# Patient Record
Sex: Female | Born: 1975 | Race: White | Hispanic: No | Marital: Married | Smoking: Never smoker
Health system: Southern US, Community
[De-identification: ages and names within clinical notes are randomized; demographics above are authoritative.]

## PROBLEM LIST (undated history)

## (undated) DIAGNOSIS — Z9889 Other specified postprocedural states: Secondary | ICD-10-CM

## (undated) DIAGNOSIS — T7840XA Allergy, unspecified, initial encounter: Secondary | ICD-10-CM

## (undated) DIAGNOSIS — M2629 Other anomalies of dental arch relationship: Secondary | ICD-10-CM

## (undated) DIAGNOSIS — I341 Nonrheumatic mitral (valve) prolapse: Secondary | ICD-10-CM

## (undated) DIAGNOSIS — R112 Nausea with vomiting, unspecified: Secondary | ICD-10-CM

## (undated) DIAGNOSIS — C4492 Squamous cell carcinoma of skin, unspecified: Secondary | ICD-10-CM

## (undated) DIAGNOSIS — Z800 Family history of malignant neoplasm of digestive organs: Secondary | ICD-10-CM

## (undated) DIAGNOSIS — C4491 Basal cell carcinoma of skin, unspecified: Secondary | ICD-10-CM

## (undated) DIAGNOSIS — J309 Allergic rhinitis, unspecified: Secondary | ICD-10-CM

## (undated) DIAGNOSIS — R011 Cardiac murmur, unspecified: Secondary | ICD-10-CM

## (undated) HISTORY — PX: TONSILLECTOMY: SHX5217

## (undated) HISTORY — DX: Nonrheumatic mitral (valve) prolapse: I34.1

## (undated) HISTORY — PX: SKIN CANCER EXCISION: SHX779

## (undated) HISTORY — DX: Cardiac murmur, unspecified: R01.1

## (undated) HISTORY — DX: Squamous cell carcinoma of skin, unspecified: C44.92

## (undated) HISTORY — DX: Allergy, unspecified, initial encounter: T78.40XA

## (undated) HISTORY — DX: Other anomalies of dental arch relationship: M26.29

## (undated) HISTORY — DX: Allergic rhinitis, unspecified: J30.9

## (undated) HISTORY — DX: Family history of malignant neoplasm of digestive organs: Z80.0

## (undated) HISTORY — DX: Basal cell carcinoma of skin, unspecified: C44.91

---
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
## 1997-09-18 ENCOUNTER — Other Ambulatory Visit: Admission: RE | Admit: 1997-09-18 | Discharge: 1997-09-18 | Payer: Self-pay | Admitting: Obstetrics and Gynecology

## 1998-10-08 ENCOUNTER — Other Ambulatory Visit: Admission: RE | Admit: 1998-10-08 | Discharge: 1998-10-08 | Payer: Self-pay | Admitting: Obstetrics and Gynecology

## 1998-12-26 ENCOUNTER — Other Ambulatory Visit: Admission: RE | Admit: 1998-12-26 | Discharge: 1998-12-26 | Payer: Self-pay | Admitting: Obstetrics and Gynecology

## 1999-11-24 ENCOUNTER — Other Ambulatory Visit: Admission: RE | Admit: 1999-11-24 | Discharge: 1999-11-24 | Payer: Self-pay | Admitting: Obstetrics and Gynecology

## 2007-10-03 DIAGNOSIS — C4490 Unspecified malignant neoplasm of skin, unspecified: Secondary | ICD-10-CM | POA: Insufficient documentation

## 2008-05-27 ENCOUNTER — Emergency Department (HOSPITAL_COMMUNITY): Admission: EM | Admit: 2008-05-27 | Discharge: 2008-05-27 | Payer: Self-pay | Admitting: Family Medicine

## 2009-05-11 DIAGNOSIS — C4491 Basal cell carcinoma of skin, unspecified: Secondary | ICD-10-CM

## 2009-05-11 HISTORY — DX: Basal cell carcinoma of skin, unspecified: C44.91

## 2010-12-15 ENCOUNTER — Ambulatory Visit: Payer: Self-pay | Admitting: Cardiology

## 2011-01-22 ENCOUNTER — Ambulatory Visit: Payer: Self-pay | Admitting: Cardiology

## 2011-01-27 ENCOUNTER — Encounter: Payer: Self-pay | Admitting: *Deleted

## 2011-01-28 ENCOUNTER — Encounter: Payer: Self-pay | Admitting: Cardiology

## 2011-01-28 ENCOUNTER — Ambulatory Visit (INDEPENDENT_AMBULATORY_CARE_PROVIDER_SITE_OTHER): Payer: BC Managed Care – PPO | Admitting: Cardiology

## 2011-01-28 VITALS — BP 110/70 | HR 55 | Resp 18 | Ht 64.0 in | Wt 136.1 lb

## 2011-01-28 DIAGNOSIS — R011 Cardiac murmur, unspecified: Secondary | ICD-10-CM

## 2011-01-28 NOTE — Patient Instructions (Signed)
Your physician has requested that you have an echocardiogram. Echocardiography is a painless test that uses sound waves to create images of your heart. It provides your doctor with information about the size and shape of your heart and how well your heart's chambers and valves are working. This procedure takes approximately one hour. There are no restrictions for this procedure.  You do not need to schedule a follow-up appointment with Dr Shirlee Latch.

## 2011-01-30 DIAGNOSIS — R011 Cardiac murmur, unspecified: Secondary | ICD-10-CM | POA: Insufficient documentation

## 2011-01-30 NOTE — Assessment & Plan Note (Signed)
Soft systolic murmur towards apex with possible systolic click.  It does sound like she may have mitral valve prolapse with some degree of mitral regurgitation.  She does not need to take antibiotics with dental work.  She has no history of exercise intolerance.  I will get an echo to assess the mitral valve.

## 2011-01-30 NOTE — Progress Notes (Signed)
35 yo with history of heart murmur presents for cardiology evaluation.  Patient was told in high school that she has a heart murmur.  This was noted on a routine physical.  At some point, she was told that she has mitral valve prolapse, but she does not remember getting an echocardiogram.  She was told to take antibiotics before dental work.  She has never had exercise intolerance.  She can jog without problems.  No syncope or lightheadedness.  No tachypalpitations.  No chest pain.  She recently moved to [facility] and had a life insurance examination.  They told her that she should have her heart murmur re-evaluated.  She has no other significant medical problems.    ECG: NSR, rate 55, normal  PMH: Heart murmur  SH: Recently moved to [place] from [place].  Married with children.  No smoking.    FH: No family history of heart disease that she knows of.   ROS: all systems reviewed and negative except as per HPI  Current Outpatient Prescriptions  Medication Sig Dispense Refill  . ibuprofen (ADVIL,MOTRIN) 200 MG tablet Take 200 mg by mouth every 6 (six) hours as needed.          BP 110/70  Pulse 55  Resp 18  Ht 5\' 4"  (1.626 m)  Wt 136 lb 1.9 oz (61.744 kg)  BMI 23.37 kg/m2 General: NAD Neck: No JVD, no thyromegaly or thyroid nodule.  Lungs: Clear to auscultation bilaterally with normal respiratory effort. CV: Nondisplaced PMI.  Heart regular S1/S2, no S3/S4, systolic click with soft systolic murmrur.  No peripheral edema.  No carotid bruit.  Normal pedal pulses.  Abdomen: Soft, nontender, no hepatosplenomegaly, no distention.  Skin: Intact without lesions or rashes.  Neurologic: Alert and oriented x 3.  Psych: Normal affect. Extremities: No clubbing or cyanosis.  HEENT: Normal.

## 2011-02-05 ENCOUNTER — Encounter: Payer: Self-pay | Admitting: *Deleted

## 2011-02-10 ENCOUNTER — Ambulatory Visit (HOSPITAL_COMMUNITY): Payer: BC Managed Care – PPO | Attending: Cardiology

## 2011-02-10 DIAGNOSIS — I359 Nonrheumatic aortic valve disorder, unspecified: Secondary | ICD-10-CM | POA: Insufficient documentation

## 2011-02-10 DIAGNOSIS — I079 Rheumatic tricuspid valve disease, unspecified: Secondary | ICD-10-CM | POA: Insufficient documentation

## 2011-02-10 DIAGNOSIS — R011 Cardiac murmur, unspecified: Secondary | ICD-10-CM | POA: Insufficient documentation

## 2011-03-20 ENCOUNTER — Other Ambulatory Visit: Payer: Self-pay | Admitting: Obstetrics and Gynecology

## 2011-03-20 DIAGNOSIS — N644 Mastodynia: Secondary | ICD-10-CM

## 2011-04-06 ENCOUNTER — Other Ambulatory Visit: Payer: Self-pay | Admitting: Obstetrics and Gynecology

## 2011-04-06 ENCOUNTER — Ambulatory Visit
Admission: RE | Admit: 2011-04-06 | Discharge: 2011-04-06 | Disposition: A | Discharge status: Home / Self Care | Payer: BC Managed Care – PPO | Source: Ambulatory Visit | Attending: Obstetrics and Gynecology | Admitting: Obstetrics and Gynecology

## 2011-04-06 DIAGNOSIS — N644 Mastodynia: Secondary | ICD-10-CM

## 2011-06-07 ENCOUNTER — Emergency Department (INDEPENDENT_AMBULATORY_CARE_PROVIDER_SITE_OTHER): Payer: BC Managed Care – PPO

## 2011-06-07 ENCOUNTER — Emergency Department (HOSPITAL_COMMUNITY)
Admission: EM | Admit: 2011-06-07 | Discharge: 2011-06-07 | Disposition: A | Discharge status: Home / Self Care | Payer: BC Managed Care – PPO | Source: Home / Self Care | Attending: Emergency Medicine | Admitting: Emergency Medicine

## 2011-06-07 ENCOUNTER — Encounter (HOSPITAL_COMMUNITY): Payer: Self-pay

## 2011-06-07 DIAGNOSIS — J159 Unspecified bacterial pneumonia: Secondary | ICD-10-CM

## 2011-06-07 MED ORDER — TRAMADOL HCL 50 MG PO TABS: 100.0000 mg | ORAL_TABLET | Freq: Three times a day (TID) | ORAL | Status: AC | PRN

## 2011-06-07 MED ORDER — LIDOCAINE HCL (PF) 1 % IJ SOLN
INTRAMUSCULAR | Status: AC
  Filled 2011-06-07: qty 5

## 2011-06-07 MED ORDER — CEFTRIAXONE SODIUM 1 G IJ SOLR
1.0000 g | Freq: Once | INTRAMUSCULAR | Status: AC
  Administered 2011-06-07: 1 g via INTRAMUSCULAR

## 2011-06-07 MED ORDER — FLUCONAZOLE 150 MG PO TABS: 150.0000 mg | ORAL_TABLET | Freq: Once | ORAL | Status: AC

## 2011-06-07 MED ORDER — CEFTRIAXONE SODIUM 1 G IJ SOLR
INTRAMUSCULAR | Status: AC
  Filled 2011-06-07: qty 10

## 2011-06-07 MED ORDER — CLARITHROMYCIN 500 MG PO TABS: 500.0000 mg | ORAL_TABLET | Freq: Two times a day (BID) | ORAL | Status: AC

## 2011-06-07 NOTE — ED Notes (Signed)
Pt has had cough and cold for two weeks and yesterday developed fever and lt sided chest pain with deep breath

## 2011-06-07 NOTE — ED Provider Notes (Signed)
Chief Complaint  Patient presents with  . Pneumonia    History of Present Illness:  The patient has had mild upper respiratory symptoms for the past 2 weeks with nasal congestion, rhinorrhea, scratchy throat, and a dry cough. This seemed to be getting better, however last night she noted the sudden onset of chest tightness, chills, felt feverish, had teeth chattering chills, and pleuritic left-sided chest pain which radiated up to her neck. She now has a cough productive of initially rusty-colored sputum. She denies any prior history of pneumonia.  Review of Systems:  Other than noted above, the patient denies any of the following symptoms. Systemic:  No fever, chills, sweats, fatigue, myalgias, headache, or anorexia. Eye:  No redness, pain or drainage. ENT:  No earache, nasal congestion, rhinorrhea, sinus pressure, or sore throat. Lungs:  No cough, sputum production, wheezing, shortness of breath. Or chest pain. GI:  No nausea, vomiting, abdominal pain or diarrhea. Skin:  No rash or itching.  PMFSH:  Past medical history, family history, social history, meds, and allergies were reviewed.  Physical Exam:   Vital signs:  BP 125/86  Pulse 75  Temp(Src) 98.1 F (36.7 C) (Oral)  Resp 16  SpO2 99% General:  Alert, in no distress. Eye:  No conjunctival injection or drainage. ENT:  TMs and canals were normal, without erythema or inflammation.  Nasal mucosa was clear and uncongested, without drainage.  Mucous membranes were moist.  Pharynx was clear, without exudate or drainage.  There were no oral ulcerations or lesions. Neck:  Supple, no adenopathy, tenderness or mass. Lungs:  No respiratory distress.  Lungs were clear to auscultation, without wheezes, rales or rhonchi.  Breath sounds were clear and equal bilaterally. Heart:  Regular rhythm, without gallops, murmers or rubs. Skin:  Clear, warm, and dry, without rash or lesions.  Labs:  No results found for this or any previous visit.    Radiology:  Dg Chest 2 View  06/07/2011  *RADIOLOGY REPORT*  Clinical Data: Cough and cold for 2 weeks.  Fever.  CHEST - 2 VIEW  Comparison: None  Findings: Heart size and mediastinal contours are normal.  No pleural effusion or pulmonary edema.  Airspace disease involving the lingular portion of the left lung is noted.  Right lung is clear.  IMPRESSION:  1.  Lingular pneumonia.  Original Report Authenticated By: Rosealee Albee, M.D.    Medications given in [facility]:  Rocephin 1 g IM  Assessment:   Diagnoses that have been ruled out:  None  Diagnoses that are still under consideration:  None  Final diagnoses:  Community acquired bacterial pneumonia      Plan:   1.  The following meds were prescribed:   New Prescriptions   CLARITHROMYCIN (BIAXIN) 500 MG TABLET    Take 1 tablet (500 mg total) by mouth 2 (two) times daily.   FLUCONAZOLE (DIFLUCAN) 150 MG TABLET    Take 1 tablet (150 mg total) by mouth once.   TRAMADOL (ULTRAM) 50 MG TABLET    Take 2 tablets (100 mg total) by mouth every 8 (eight) hours as needed for pain.   2.  The patient was instructed in symptomatic care and handouts were given. 3.  The patient was told to return if becoming worse in any way, if no better in 3 or 4 days, and given some red flag symptoms that would indicate earlier return. I told her to come back in 48 hours for a recheck. I don't think she needs  a repeat chest x-ray at that time. She should get a repeat chest x-ray in about a month.    Roque Lias, MD 06/07/11 [phone]

## 2011-09-30 LAB — OB RESULTS CONSOLE ANTIBODY SCREEN: Antibody Screen: NEGATIVE

## 2011-09-30 LAB — OB RESULTS CONSOLE HEPATITIS B SURFACE ANTIGEN: Hepatitis B Surface Ag: NEGATIVE

## 2011-09-30 LAB — OB RESULTS CONSOLE RUBELLA ANTIBODY, IGM: Rubella: IMMUNE

## 2012-04-25 ENCOUNTER — Encounter (HOSPITAL_COMMUNITY): Payer: Self-pay | Admitting: Pharmacist

## 2012-04-29 ENCOUNTER — Encounter (HOSPITAL_COMMUNITY): Payer: Self-pay

## 2012-05-02 ENCOUNTER — Encounter (HOSPITAL_COMMUNITY): Payer: Self-pay

## 2012-05-02 ENCOUNTER — Encounter (HOSPITAL_COMMUNITY)
Admission: RE | Admit: 2012-05-02 | Discharge: 2012-05-02 | Disposition: A | Discharge status: Home / Self Care | Payer: BC Managed Care – PPO | Source: Ambulatory Visit | Attending: Obstetrics and Gynecology | Admitting: Obstetrics and Gynecology

## 2012-05-02 HISTORY — DX: Other specified postprocedural states: Z98.890

## 2012-05-02 HISTORY — DX: Other specified postprocedural states: R11.2

## 2012-05-02 HISTORY — DX: Nausea with vomiting, unspecified: R11.2

## 2012-05-02 LAB — CBC
HCT: 31 % — ABNORMAL LOW (ref 36.0–46.0)
MCV: 88.8 fL (ref 78.0–100.0)
RDW: 13.2 % (ref 11.5–15.5)
WBC: 10.2 10*3/uL (ref 4.0–10.5)

## 2012-05-02 LAB — SURGICAL PCR SCREEN: Staphylococcus aureus: NEGATIVE

## 2012-05-02 LAB — ABO/RH: ABO/RH(D): B POS

## 2012-05-02 NOTE — Patient Instructions (Addendum)
   Your procedure is scheduled AV:[id number] December 27th  Enter through the Main Entrance of [facility] at:7:30am Pick up the phone at the desk and dial [phone] and inform us of your arrival.  Please call this number if you have any problems the morning of surgery: [phone]  Remember: Do not eat or drink anything after midnight on Thursday   Do not wear jewelry, make-up, or FINGER nail polish No metal in your hair or on your body. Do not wear lotions, powders, perfumes. You may wear deodorant.  Please use your CHG wash as directed prior to surgery.  Do not shave anywhere for at least 12 hours prior to first CHG shower.  Do not bring valuables to the hospital.  Leave suitcase in the car. After Surgery it may be brought to your room. For patients being admitted to the hospital, checkout time is 11:00am the day of discharge.

## 2012-05-03 NOTE — H&P (Addendum)
Melinda Carrillo is a 36 y.o. female presenting for repeat C/S due to 2 prior C/Ss.  Pregnancy complicated by AMA with normal Materni 21. History OB History    Grav Para Term Preterm Abortions TAB SAB Ect Mult Living   3 2 2       2      Past Medical History  Diagnosis Date  . Heart murmur   . PONV (postoperative nausea and vomiting)    Past Surgical History  Procedure Date  . Cesarean section     x 2  . Tonsillectomy    Family History: family history includes Other (age of onset:31) in her brother; Other (age of onset:62) in her mother; and Other (age of onset:63) in her father. Social History:  reports that she has never smoked. She does not have any smokeless tobacco history on file. She reports that she does not drink alcohol or use illicit drugs.   Prenatal Transfer Tool  Maternal Diabetes: no Genetic Screening: Normal Maternal Ultrasounds/Referrals: Normal Fetal Ultrasounds or other Referrals:  None Maternal Substance Abuse:  No Significant Maternal Medications:  None Significant Maternal Lab Results:  None Other Comments:  None  ROS    Last menstrual period 08/04/2011. Exam Physical Exam   Cx Cl/th/high Prenatal labs: ABO, Rh: --/--/B POS (12/23 1125) Antibody: NEG (12/23 1118) Rubella: Immune (05/22 0000) RPR: NON REACTIVE (12/23 1125)  HBsAg: Negative (05/22 0000)  HIV: Non-reactive (05/22 0000)  GBS:     Assessment/Plan: IUP at 39 weeks Prev C/S x 2 for repeat Plan Repeat [facility] Risks and benefits of C/S were discussed.  All questions were answered and informed consent was obtained.  Plan to proceed with low segment transverse Cesarean Section.    Madoline Bhatt C 05/03/2012, 1:29 PM     This patient has been seen and examined.   All of her questions were answered.  Labs and vital signs reviewed.  Informed consent has been obtained.  The History and Physical is current.  05/06/12 0845 DL

## 2012-05-05 MED ORDER — DEXTROSE 5 % IV SOLN
2.0000 g | INTRAVENOUS | Status: AC
  Administered 2012-05-06: 2 g via INTRAVENOUS
  Filled 2012-05-05: qty 2

## 2012-05-06 ENCOUNTER — Encounter (HOSPITAL_COMMUNITY): Payer: Self-pay | Admitting: *Deleted

## 2012-05-06 ENCOUNTER — Encounter (HOSPITAL_COMMUNITY): Payer: Self-pay | Admitting: Anesthesiology

## 2012-05-06 ENCOUNTER — Inpatient Hospital Stay (HOSPITAL_COMMUNITY): Payer: BC Managed Care – PPO | Admitting: Anesthesiology

## 2012-05-06 ENCOUNTER — Inpatient Hospital Stay (HOSPITAL_COMMUNITY)
Admission: RE | Admit: 2012-05-06 | Discharge: 2012-05-09 | DRG: 371 | Disposition: A | Discharge status: Home / Self Care | Payer: BC Managed Care – PPO | Source: Ambulatory Visit | Attending: Obstetrics and Gynecology | Admitting: Obstetrics and Gynecology

## 2012-05-06 ENCOUNTER — Encounter (HOSPITAL_COMMUNITY)
Admission: RE | Disposition: A | Discharge status: Home / Self Care | Payer: Self-pay | Source: Ambulatory Visit | Attending: Obstetrics and Gynecology

## 2012-05-06 DIAGNOSIS — O34219 Maternal care for unspecified type scar from previous cesarean delivery: Principal | ICD-10-CM | POA: Diagnosis present

## 2012-05-06 DIAGNOSIS — Z01818 Encounter for other preprocedural examination: Secondary | ICD-10-CM

## 2012-05-06 DIAGNOSIS — O09529 Supervision of elderly multigravida, unspecified trimester: Secondary | ICD-10-CM | POA: Diagnosis present

## 2012-05-06 DIAGNOSIS — Z01812 Encounter for preprocedural laboratory examination: Secondary | ICD-10-CM

## 2012-05-06 LAB — TYPE AND SCREEN: Antibody Screen: NEGATIVE

## 2012-05-06 LAB — CBC
HCT: 32.8 % — ABNORMAL LOW (ref 36.0–46.0)
MCHC: 33.2 g/dL (ref 30.0–36.0)
Platelets: 152 10*3/uL (ref 150–400)
RDW: 13.3 % (ref 11.5–15.5)
WBC: 15 10*3/uL — ABNORMAL HIGH (ref 4.0–10.5)

## 2012-05-06 SURGERY — Surgical Case
Anesthesia: Spinal | Site: Abdomen | Wound class: Clean Contaminated

## 2012-05-06 MED ORDER — ONDANSETRON HCL 4 MG PO TABS: 4.0000 mg | ORAL_TABLET | ORAL | Status: DC | PRN

## 2012-05-06 MED ORDER — EPHEDRINE 5 MG/ML INJ
INTRAVENOUS | Status: AC
  Filled 2012-05-06: qty 10

## 2012-05-06 MED ORDER — SENNOSIDES-DOCUSATE SODIUM 8.6-50 MG PO TABS
2.0000 | ORAL_TABLET | Freq: Every day | ORAL | Status: DC
  Administered 2012-05-06 – 2012-05-08 (×3): 2 via ORAL

## 2012-05-06 MED ORDER — LACTATED RINGERS IV SOLN
INTRAVENOUS | Status: DC | PRN
  Administered 2012-05-06: 10:00:00 via INTRAVENOUS

## 2012-05-06 MED ORDER — KETOROLAC TROMETHAMINE 30 MG/ML IJ SOLN: 15.0000 mg | Freq: Once | INTRAMUSCULAR | Status: DC | PRN

## 2012-05-06 MED ORDER — NALBUPHINE HCL 10 MG/ML IJ SOLN
5.0000 mg | INTRAMUSCULAR | Status: DC | PRN
  Filled 2012-05-06: qty 1

## 2012-05-06 MED ORDER — PRENATAL MULTIVITAMIN CH: 1.0000 | ORAL_TABLET | Freq: Every day | ORAL | Status: DC

## 2012-05-06 MED ORDER — SIMETHICONE 80 MG PO CHEW
80.0000 mg | CHEWABLE_TABLET | ORAL | Status: DC | PRN
  Administered 2012-05-07: 80 mg via ORAL

## 2012-05-06 MED ORDER — ONDANSETRON HCL 4 MG/2ML IJ SOLN: 4.0000 mg | Freq: Three times a day (TID) | INTRAMUSCULAR | Status: DC | PRN

## 2012-05-06 MED ORDER — NALOXONE HCL 0.4 MG/ML IJ SOLN: 0.4000 mg | INTRAMUSCULAR | Status: DC | PRN

## 2012-05-06 MED ORDER — TETANUS-DIPHTH-ACELL PERTUSSIS 5-2.5-18.5 LF-MCG/0.5 IM SUSP: 0.5000 mL | Freq: Once | INTRAMUSCULAR | Status: DC

## 2012-05-06 MED ORDER — MORPHINE SULFATE (PF) 0.5 MG/ML IJ SOLN
INTRAMUSCULAR | Status: DC | PRN
  Administered 2012-05-06: .15 mg via INTRATHECAL

## 2012-05-06 MED ORDER — SCOPOLAMINE 1 MG/3DAYS TD PT72: 1.0000 | MEDICATED_PATCH | Freq: Once | TRANSDERMAL | Status: DC

## 2012-05-06 MED ORDER — DIPHENHYDRAMINE HCL 50 MG/ML IJ SOLN: 12.5000 mg | INTRAMUSCULAR | Status: DC | PRN

## 2012-05-06 MED ORDER — SIMETHICONE 80 MG PO CHEW
80.0000 mg | CHEWABLE_TABLET | Freq: Three times a day (TID) | ORAL | Status: DC
  Administered 2012-05-06 – 2012-05-09 (×11): 80 mg via ORAL

## 2012-05-06 MED ORDER — MENTHOL 3 MG MT LOZG: 1.0000 | LOZENGE | OROMUCOSAL | Status: DC | PRN

## 2012-05-06 MED ORDER — KETOROLAC TROMETHAMINE 30 MG/ML IJ SOLN: 30.0000 mg | Freq: Four times a day (QID) | INTRAMUSCULAR | Status: AC | PRN

## 2012-05-06 MED ORDER — KETOROLAC TROMETHAMINE 30 MG/ML IJ SOLN: 30.0000 mg | Freq: Four times a day (QID) | INTRAMUSCULAR | Status: DC | PRN

## 2012-05-06 MED ORDER — OXYTOCIN 10 UNIT/ML IJ SOLN
INTRAMUSCULAR | Status: AC
  Filled 2012-05-06: qty 4

## 2012-05-06 MED ORDER — KETOROLAC TROMETHAMINE 60 MG/2ML IM SOLN
INTRAMUSCULAR | Status: AC
  Filled 2012-05-06: qty 2

## 2012-05-06 MED ORDER — METOCLOPRAMIDE HCL 5 MG/ML IJ SOLN: 10.0000 mg | Freq: Three times a day (TID) | INTRAMUSCULAR | Status: DC | PRN

## 2012-05-06 MED ORDER — LACTATED RINGERS IV SOLN
INTRAVENOUS | Status: DC
  Administered 2012-05-06: 16:00:00 via INTRAVENOUS

## 2012-05-06 MED ORDER — WITCH HAZEL-GLYCERIN EX PADS: 1.0000 "application " | MEDICATED_PAD | CUTANEOUS | Status: DC | PRN

## 2012-05-06 MED ORDER — MEPERIDINE HCL 25 MG/ML IJ SOLN: 6.2500 mg | INTRAMUSCULAR | Status: DC | PRN

## 2012-05-06 MED ORDER — DIPHENHYDRAMINE HCL 25 MG PO CAPS
25.0000 mg | ORAL_CAPSULE | ORAL | Status: DC | PRN
  Filled 2012-05-06: qty 1

## 2012-05-06 MED ORDER — EPHEDRINE SULFATE 50 MG/ML IJ SOLN
INTRAMUSCULAR | Status: DC | PRN
  Administered 2012-05-06 (×2): 5 mg via INTRAVENOUS

## 2012-05-06 MED ORDER — KETOROLAC TROMETHAMINE 60 MG/2ML IM SOLN: 60.0000 mg | Freq: Once | INTRAMUSCULAR | Status: DC | PRN

## 2012-05-06 MED ORDER — KETOROLAC TROMETHAMINE 60 MG/2ML IM SOLN
60.0000 mg | Freq: Once | INTRAMUSCULAR | Status: AC | PRN
  Administered 2012-05-06: 60 mg via INTRAMUSCULAR

## 2012-05-06 MED ORDER — DIPHENHYDRAMINE HCL 25 MG PO CAPS: 25.0000 mg | ORAL_CAPSULE | Freq: Four times a day (QID) | ORAL | Status: DC | PRN

## 2012-05-06 MED ORDER — DIBUCAINE 1 % RE OINT: 1.0000 "application " | TOPICAL_OINTMENT | RECTAL | Status: DC | PRN

## 2012-05-06 MED ORDER — FENTANYL CITRATE 0.05 MG/ML IJ SOLN
INTRAMUSCULAR | Status: DC | PRN
  Administered 2012-05-06: 25 ug via INTRATHECAL

## 2012-05-06 MED ORDER — LACTATED RINGERS IV SOLN
INTRAVENOUS | Status: DC | PRN
  Administered 2012-05-06 (×3): via INTRAVENOUS

## 2012-05-06 MED ORDER — SCOPOLAMINE 1 MG/3DAYS TD PT72
MEDICATED_PATCH | TRANSDERMAL | Status: AC
  Administered 2012-05-06: 1.5 mg via TRANSDERMAL
  Filled 2012-05-06: qty 1

## 2012-05-06 MED ORDER — LACTATED RINGERS IV SOLN
Freq: Once | INTRAVENOUS | Status: AC
  Administered 2012-05-06: 08:00:00 via INTRAVENOUS

## 2012-05-06 MED ORDER — PROMETHAZINE HCL 25 MG/ML IJ SOLN: 6.2500 mg | INTRAMUSCULAR | Status: DC | PRN

## 2012-05-06 MED ORDER — ZOLPIDEM TARTRATE 5 MG PO TABS: 5.0000 mg | ORAL_TABLET | Freq: Every evening | ORAL | Status: DC | PRN

## 2012-05-06 MED ORDER — IBUPROFEN 600 MG PO TABS
600.0000 mg | ORAL_TABLET | Freq: Four times a day (QID) | ORAL | Status: DC
  Administered 2012-05-06 – 2012-05-09 (×11): 600 mg via ORAL
  Filled 2012-05-06 (×11): qty 1

## 2012-05-06 MED ORDER — PRENATAL MULTIVITAMIN CH
1.0000 | ORAL_TABLET | Freq: Every day | ORAL | Status: DC
  Administered 2012-05-07 – 2012-05-09 (×3): 1 via ORAL
  Filled 2012-05-06 (×3): qty 1

## 2012-05-06 MED ORDER — DIPHENHYDRAMINE HCL 50 MG/ML IJ SOLN: 25.0000 mg | INTRAMUSCULAR | Status: DC | PRN

## 2012-05-06 MED ORDER — ONDANSETRON HCL 4 MG/2ML IJ SOLN
INTRAMUSCULAR | Status: DC | PRN
  Administered 2012-05-06: 4 mg via INTRAVENOUS

## 2012-05-06 MED ORDER — NALOXONE HCL 1 MG/ML IJ SOLN: 1.0000 ug/kg/h | INTRAVENOUS | Status: DC | PRN

## 2012-05-06 MED ORDER — FENTANYL CITRATE 0.05 MG/ML IJ SOLN
INTRAMUSCULAR | Status: AC
  Filled 2012-05-06: qty 2

## 2012-05-06 MED ORDER — ONDANSETRON HCL 4 MG/2ML IJ SOLN: 4.0000 mg | INTRAMUSCULAR | Status: DC | PRN

## 2012-05-06 MED ORDER — SODIUM CHLORIDE 0.9 % IJ SOLN: 3.0000 mL | INTRAMUSCULAR | Status: DC | PRN

## 2012-05-06 MED ORDER — SCOPOLAMINE 1 MG/3DAYS TD PT72
1.0000 | MEDICATED_PATCH | Freq: Once | TRANSDERMAL | Status: DC
  Administered 2012-05-06: 1.5 mg via TRANSDERMAL

## 2012-05-06 MED ORDER — OXYCODONE-ACETAMINOPHEN 5-325 MG PO TABS
1.0000 | ORAL_TABLET | ORAL | Status: DC | PRN
  Administered 2012-05-07 (×2): 1 via ORAL
  Filled 2012-05-06: qty 1
  Filled 2012-05-06: qty 2

## 2012-05-06 MED ORDER — MORPHINE SULFATE 0.5 MG/ML IJ SOLN
INTRAMUSCULAR | Status: AC
  Filled 2012-05-06: qty 10

## 2012-05-06 MED ORDER — DIPHENHYDRAMINE HCL 25 MG PO CAPS: 25.0000 mg | ORAL_CAPSULE | ORAL | Status: DC | PRN

## 2012-05-06 MED ORDER — IBUPROFEN 600 MG PO TABS: 600.0000 mg | ORAL_TABLET | Freq: Four times a day (QID) | ORAL | Status: DC | PRN

## 2012-05-06 MED ORDER — OXYTOCIN 40 UNITS IN LACTATED RINGERS INFUSION - SIMPLE MED: 62.5000 mL/h | INTRAVENOUS | Status: AC

## 2012-05-06 MED ORDER — OXYTOCIN 40 UNITS IN LACTATED RINGERS INFUSION - SIMPLE MED
INTRAVENOUS | Status: DC | PRN
  Administered 2012-05-06: 40 [IU] via INTRAVENOUS

## 2012-05-06 MED ORDER — LANOLIN HYDROUS EX OINT: 1.0000 "application " | TOPICAL_OINTMENT | CUTANEOUS | Status: DC | PRN

## 2012-05-06 MED ORDER — ONDANSETRON HCL 4 MG/2ML IJ SOLN
INTRAMUSCULAR | Status: AC
  Filled 2012-05-06: qty 2

## 2012-05-06 MED ORDER — FENTANYL CITRATE 0.05 MG/ML IJ SOLN: 25.0000 ug | INTRAMUSCULAR | Status: DC | PRN

## 2012-05-06 MED ORDER — HYDROMORPHONE HCL PF 1 MG/ML IJ SOLN: 0.2500 mg | INTRAMUSCULAR | Status: DC | PRN

## 2012-05-06 MED ORDER — PHENYLEPHRINE 40 MCG/ML (10ML) SYRINGE FOR IV PUSH (FOR BLOOD PRESSURE SUPPORT)
PREFILLED_SYRINGE | INTRAVENOUS | Status: AC
  Filled 2012-05-06: qty 5

## 2012-05-06 MED ORDER — PHENYLEPHRINE HCL 10 MG/ML IJ SOLN
INTRAMUSCULAR | Status: DC | PRN
  Administered 2012-05-06 (×4): 40 ug via INTRAVENOUS
  Administered 2012-05-06 (×2): 80 ug via INTRAVENOUS
  Administered 2012-05-06 (×2): 40 ug via INTRAVENOUS

## 2012-05-06 SURGICAL SUPPLY — 32 items
CLOTH BEACON ORANGE TIMEOUT ST (SAFETY) ×2 IMPLANT
DRAPE LG THREE QUARTER DISP (DRAPES) ×2 IMPLANT
DRESSING TELFA 8X3 (GAUZE/BANDAGES/DRESSINGS) IMPLANT
DRSG OPSITE 11X17.75 LRG (GAUZE/BANDAGES/DRESSINGS) ×1 IMPLANT
DRSG OPSITE POSTOP 4X10 (GAUZE/BANDAGES/DRESSINGS) IMPLANT
DURAPREP 26ML APPLICATOR (WOUND CARE) ×2 IMPLANT
ELECT REM PT RETURN 9FT ADLT (ELECTROSURGICAL) ×2
ELECTRODE REM PT RTRN 9FT ADLT (ELECTROSURGICAL) ×1 IMPLANT
EXTRACTOR VACUUM M CUP 4 TUBE (SUCTIONS) IMPLANT
GAUZE SPONGE 4X4 12PLY STRL LF (GAUZE/BANDAGES/DRESSINGS) IMPLANT
GLOVE BIO SURGEON STRL SZ8 (GLOVE) ×2 IMPLANT
GLOVE SURG ORTHO 8.0 STRL STRW (GLOVE) ×2 IMPLANT
GOWN PREVENTION PLUS LG XLONG (DISPOSABLE) ×4 IMPLANT
KIT ABG SYR 3ML LUER SLIP (SYRINGE) ×2 IMPLANT
NDL HYPO 25X5/8 SAFETYGLIDE (NEEDLE) ×1 IMPLANT
NEEDLE HYPO 25X5/8 SAFETYGLIDE (NEEDLE) ×2 IMPLANT
NS IRRIG 1000ML POUR BTL (IV SOLUTION) ×2 IMPLANT
PACK C SECTION WH (CUSTOM PROCEDURE TRAY) ×2 IMPLANT
PAD ABD 7.5X8 STRL (GAUZE/BANDAGES/DRESSINGS) IMPLANT
PAD OB MATERNITY 4.3X12.25 (PERSONAL CARE ITEMS) ×1 IMPLANT
SLEEVE SCD COMPRESS KNEE MED (MISCELLANEOUS) IMPLANT
STAPLER VISISTAT 35W (STAPLE) IMPLANT
SUT MNCRL 0 VIOLET CTX 36 (SUTURE) ×3 IMPLANT
SUT MONOCRYL 0 CTX 36 (SUTURE) ×3
SUT PDS AB 1 CT  36 (SUTURE)
SUT PDS AB 1 CT 36 (SUTURE) IMPLANT
SUT PLAIN 2 0 XLH (SUTURE) ×1 IMPLANT
SUT VIC AB 1 CTX 36 (SUTURE)
SUT VIC AB 1 CTX36XBRD ANBCTRL (SUTURE) IMPLANT
TOWEL OR 17X24 6PK STRL BLUE (TOWEL DISPOSABLE) ×6 IMPLANT
TRAY FOLEY CATH 14FR (SET/KITS/TRAYS/PACK) ×2 IMPLANT
WATER STERILE IRR 1000ML POUR (IV SOLUTION) ×2 IMPLANT

## 2012-05-06 NOTE — Anesthesia Procedure Notes (Signed)

## 2012-05-06 NOTE — Transfer of Care (Signed)
Immediate Anesthesia Transfer of Care Note  Patient: Melinda Carrillo  Procedure(s) Performed: Procedure(s) (LRB) with comments: CESAREAN SECTION (N/A) - Repeat edc 05/10/12  Patient Location: PACU  Anesthesia Type:Spinal  Level of Consciousness: awake, alert  and oriented  Airway & Oxygen Therapy: Patient Spontanous Breathing  Post-op Assessment: Report given to PACU RN and Post -op Vital signs reviewed and stable  Post vital signs: Reviewed and stable  Complications: No apparent anesthesia complications

## 2012-05-06 NOTE — Anesthesia Preprocedure Evaluation (Signed)
Anesthesia Evaluation  Patient identified by MRN, date of birth, ID band Patient awake    Reviewed: Allergy & Precautions, H&P , NPO status , Patient's Chart, lab work & pertinent test results, reviewed documented beta blocker date and time   Airway Mallampati: I TM Distance: >3 FB Neck ROM: full    Dental No notable dental hx. (+) Teeth Intact   Pulmonary neg pulmonary ROS,    Pulmonary exam normal       Cardiovascular negative cardio ROS      Neuro/Psych negative neurological ROS  negative psych ROS   GI/Hepatic negative GI ROS, Neg liver ROS,   Endo/Other  negative endocrine ROS  Renal/GU negative Renal ROS  negative genitourinary   Musculoskeletal negative musculoskeletal ROS (+)   Abdominal Normal abdominal exam  (+)   Peds negative pediatric ROS (+)  Hematology negative hematology ROS (+)   Anesthesia Other Findings   Reproductive/Obstetrics (+) Pregnancy                           Anesthesia Physical Anesthesia Plan  ASA: II  Anesthesia Plan: Spinal   Post-op Pain Management:    Induction:   Airway Management Planned:   Additional Equipment:   Intra-op Plan:   Post-operative Plan:   Informed Consent: I have reviewed the patients History and Physical, chart, labs and discussed the procedure including the risks, benefits and alternatives for the proposed anesthesia with the patient or authorized representative who has indicated his/her understanding and acceptance.     Plan Discussed with: CRNA and Surgeon  Anesthesia Plan Comments:         Anesthesia Quick Evaluation

## 2012-05-06 NOTE — Op Note (Signed)
Cesarean Section Procedure Note  Pre-operative Diagnosis: IUP at 39 weeks, prev C/S x 2 for repeat  Post-operative Diagnosis: same  Surgeon: Turner Daniels   Assistants: none  Anesthesia:Spinal  Procedure:  Low Segment Transverse cesarean section  Procedure Details  The patient was seen in the Holding Room. The risks, benefits, complications, treatment options, and expected outcomes were discussed with the patient.  The patient concurred with the proposed plan, giving informed consent.  The site of surgery properly noted/marked.. A Time Out was held and the above information confirmed.  After induction of anesthesia, the patient was draped and prepped in the usual sterile manner. A Pfannenstiel incision was made and carried down through the subcutaneous tissue to the fascia. The previous scar was removed from the skin.  Fascial incision was made and extended transversely. The fascia was separated from the underlying rectus tissue superiorly and inferiorly. The peritoneum was identified and entered. Peritoneal incision was extended longitudinally. The utero-vesical peritoneal reflection was incised transversely and the bladder flap was bluntly freed from the lower uterine segment. A low transverse uterine incision was made. Delivered from vertex presentation with 3 pulls with mushroom VE was a with Apgar scores of 9 at one minute and 9 at five minutes. After the umbilical cord was clamped and cut cord blood was obtained for evaluation including the patients Stem cell collection per their request.  The placenta was removed intact and appeared normal. The uterine outline, tubes and ovaries appeared normal. The uterine incision was closed with running locked sutures of 0 monocryl and imbricated with 0 monocryl. Hemostasis was observed. Lavage was carried out until clear. The peritoneum was then closed with 0 monocryl and rectus muscles plicated in the midline.  After hemostasis was assured, the fascia was  then reapproximated with running sutures of 0 Vicryl. 0 Plain suture to reapproximate the campers fascia.  Irrigation was applied and after adequate hemostasis was assured, the skin was reapproximated with staples.  Instrument, sponge, and needle counts were correct prior the abdominal closure and at the conclusion of the case. The patient received 2 grams cefotetan preoperatively.  Findings: Viable female  Estimated Blood Loss:  500cc         Specimens: Placenta was sent to L & D         Complications:  None

## 2012-05-06 NOTE — Anesthesia Postprocedure Evaluation (Signed)
  Anesthesia Post-op Note  Patient: Melinda Carrillo  Procedure(s) Performed: Procedure(s) (LRB) with comments: CESAREAN SECTION (N/A) - Repeat edc 05/10/12  Patient is awake, responsive, moving her legs, and has signs of resolution of her numbness. Pain and nausea are reasonably well controlled. Vital signs are stable and clinically acceptable. Oxygen saturation is clinically acceptable. There are no apparent anesthetic complications at this time. Patient is ready for discharge.

## 2012-05-06 NOTE — Progress Notes (Signed)
Pt urine output has been slow, pt had 50ml of urine output in 1 hr b/t 1340-1440, and then decreased to 30ml b/t 1440-1620, urine has also become more concentrated.  Pt has adequate fluid intake of in 3 hrs.  Spoke to Dr. Langston Masker and she ordered to D/C Pitocin which was running at 62.39ml/hr and start the LR at 137ml/hr.  Dr. Langston Masker also ordered a STAT CBC.  Will continue to follow and call MD with any changes.

## 2012-05-06 NOTE — Addendum Note (Signed)
Addendum  created 05/06/12 1209 by Jiles Garter, MD   Modules edited:Orders, PRL Based Order Sets

## 2012-05-07 LAB — CBC
Hemoglobin: 9.5 g/dL — ABNORMAL LOW (ref 12.0–15.0)
Platelets: 140 10*3/uL — ABNORMAL LOW (ref 150–400)
RBC: 3.13 MIL/uL — ABNORMAL LOW (ref 3.87–5.11)
WBC: 8.7 10*3/uL (ref 4.0–10.5)

## 2012-05-07 MED ORDER — BISACODYL 10 MG RE SUPP
10.0000 mg | Freq: Every day | RECTAL | Status: DC | PRN
  Administered 2012-05-07: 10 mg via RECTAL
  Filled 2012-05-07: qty 1

## 2012-05-07 NOTE — Addendum Note (Signed)
Addendum  created 05/07/12 0815 by Shanon Payor, CRNA   Modules edited:Notes Section

## 2012-05-07 NOTE — Anesthesia Postprocedure Evaluation (Signed)
  Anesthesia Post-op Note  Patient: Melinda Carrillo  Procedure(s) Performed: Procedure(s) (LRB) with comments: CESAREAN SECTION (N/A) - Repeat edc 05/10/12  Patient Location: Mother/Baby  Anesthesia Type:Spinal  Level of Consciousness: awake, alert  and oriented  Airway and Oxygen Therapy: Patient Spontanous Breathing  Post-op Pain: mild  Post-op Assessment: Post-op Vital signs reviewed, Patient's Cardiovascular Status Stable, No headache, No backache, No residual numbness and No residual motor weakness  Post-op Vital Signs: Reviewed and stable  Complications: No apparent anesthesia complications

## 2012-05-07 NOTE — Progress Notes (Signed)
Subjective: Postpartum Day 1: Cesarean Delivery Patient reports tolerating PO and no problems voiding.    Objective: Vital signs in last 24 hours: Temp:  [97.3 F (36.3 C)-98.8 F (37.1 C)] 98.6 F (37 C) (12/28 0400) Pulse Rate:  [52-79] 65  (12/28 0400) Resp:  [14-24] 18  (12/28 0400) BP: (92-115)/(53-67) 98/59 mmHg (12/28 0400) SpO2:  [97 %-100 %] 98 % (12/28 0400)  Physical Exam:  General: alert, cooperative and appears stated age 36: appropriate Uterine Fundus: firm Incision: healing well, no significant drainage, no dehiscence, no significant erythema DVT Evaluation: No evidence of DVT seen on physical exam. Negative Homan's sign. No cords or calf tenderness.   Basename 05/07/12 0610 05/06/12 1640  HGB 9.5* 10.9*  HCT 27.9* 32.8*    Assessment/Plan: Status post Cesarean section. Doing well postoperatively.  Continue current care.  Findlay Dagher 05/07/2012, 7:58 AM

## 2012-05-08 NOTE — Progress Notes (Signed)
Subjective: Postpartum Day 2: Cesarean Delivery Patient reports tolerating PO and no problems voiding.  Gas pain improved with ambulation and Mylicon.  Objective: Vital signs in last 24 hours: Temp:  [97.7 F (36.5 C)-98.2 F (36.8 C)] 97.8 F (36.6 C) (12/29 0539) Pulse Rate:  [65-84] 84  (12/29 0539) Resp:  [18] 18  (12/29 0539) BP: (106-113)/(67-72) 113/72 mmHg (12/29 0539)  Physical Exam:  General: alert, cooperative and appears stated age Lochia: appropriate Uterine Fundus: firm Incision: healing well, no significant drainage, no dehiscence, no significant erythema DVT Evaluation: No evidence of DVT seen on physical exam. Negative Homan's sign. No cords or calf tenderness.   Basename 05/07/12 0610 05/06/12 1640  HGB 9.5* 10.9*  HCT 27.9* 32.8*    Assessment/Plan: Status post Cesarean section. Doing well postoperatively.  Continue current care.  Shahin Knierim 05/08/2012, 7:54 AM

## 2012-05-09 ENCOUNTER — Encounter (HOSPITAL_COMMUNITY): Payer: Self-pay | Admitting: Obstetrics and Gynecology

## 2012-05-09 MED ORDER — IBUPROFEN 600 MG PO TABS: 600.0000 mg | ORAL_TABLET | Freq: Four times a day (QID) | ORAL | Status: DC | PRN

## 2012-05-09 MED ORDER — OXYCODONE-ACETAMINOPHEN 5-325 MG PO TABS
1.0000 | ORAL_TABLET | Freq: Four times a day (QID) | ORAL | Status: DC | PRN
Start: 2012-05-09 — End: 2016-07-06

## 2012-05-09 NOTE — Discharge Summary (Signed)
Obstetric Discharge Summary Reason for Admission: cesarean section Prenatal Procedures: ultrasound Intrapartum Procedures: cesarean: low cervical, transverse Postpartum Procedures: none Complications-Operative and Postpartum: none Hemoglobin  Date Value Range Status  05/07/2012 9.5* 12.0 - 15.0 g/dL Final     HCT  Date Value Range Status  05/07/2012 27.9* 36.0 - 46.0 % Final    Physical Exam:  General: alert, cooperative and no distress Lochia: appropriate Uterine Fundus: firm Incision: healing well DVT Evaluation: No evidence of DVT seen on physical exam.  Discharge Diagnoses: Term Pregnancy-delivered  Discharge Information: Date: 05/09/2012 Activity: pelvic rest Diet: routine Medications: PNV, Ibuprofen and Percocet Condition: stable Instructions: refer to practice specific booklet Discharge to: home   Newborn Data: Live born female  Birth Weight: 7 lb 12.3 oz (3525 g) APGAR: 9, 9  Home with mother.  Chuck Caban II,Orli Degrave E 05/09/2012, 9:31 AM

## 2012-05-09 NOTE — Progress Notes (Signed)
Subjective: Postpartum Day 3: Cesarean Delivery Patient reports tolerating PO, + flatus, + BM and no problems voiding.    Objective: Vital signs in last 24 hours: Temp:  [97.8 F (36.6 C)-98.2 F (36.8 C)] 97.9 F (36.6 C) (12/30 0540) Pulse Rate:  [62-73] 62  (12/30 0540) Resp:  [18] 18  (12/30 0540) BP: (110-117)/(70-74) 110/73 mmHg (12/30 0540)  Physical Exam:  General: alert, cooperative and no distress Lochia: appropriate Uterine Fundus: firm Incision: healing well DVT Evaluation: No evidence of DVT seen on physical exam.   Basename 05/07/12 0610 05/06/12 1640  HGB 9.5* 10.9*  HCT 27.9* 32.8*    Assessment/Plan: Status post Cesarean section. Doing well postoperatively.  Discharge home with standard precautions and return to clinic in 4-6 weeks.  Zenola Dezarn II,Ridge Lafond E 05/09/2012, 9:29 AM

## 2014-03-12 ENCOUNTER — Encounter (HOSPITAL_COMMUNITY): Payer: Self-pay | Admitting: Obstetrics and Gynecology

## 2015-06-21 ENCOUNTER — Ambulatory Visit
Admission: RE | Admit: 2015-06-21 | Discharge: 2015-06-21 | Disposition: A | Discharge status: Home / Self Care | Payer: BLUE CROSS/BLUE SHIELD | Source: Ambulatory Visit | Attending: Nurse Practitioner | Admitting: Nurse Practitioner

## 2015-06-21 ENCOUNTER — Other Ambulatory Visit: Payer: Self-pay | Admitting: Nurse Practitioner

## 2015-06-21 DIAGNOSIS — N6310 Unspecified lump in the right breast, unspecified quadrant: Secondary | ICD-10-CM

## 2015-06-25 ENCOUNTER — Other Ambulatory Visit: Payer: Self-pay

## 2015-06-26 ENCOUNTER — Other Ambulatory Visit: Payer: Self-pay

## 2016-07-06 ENCOUNTER — Encounter: Payer: Self-pay | Admitting: Allergy and Immunology

## 2016-07-06 ENCOUNTER — Ambulatory Visit (INDEPENDENT_AMBULATORY_CARE_PROVIDER_SITE_OTHER): Payer: BLUE CROSS/BLUE SHIELD | Admitting: Allergy and Immunology

## 2016-07-06 VITALS — BP 92/68 | HR 73 | Temp 98.6°F | Resp 17 | Ht 64.5 in | Wt 152.0 lb

## 2016-07-06 DIAGNOSIS — J3089 Other allergic rhinitis: Secondary | ICD-10-CM

## 2016-07-06 DIAGNOSIS — H1010 Acute atopic conjunctivitis, unspecified eye: Secondary | ICD-10-CM | POA: Insufficient documentation

## 2016-07-06 DIAGNOSIS — J309 Allergic rhinitis, unspecified: Secondary | ICD-10-CM | POA: Insufficient documentation

## 2016-07-06 DIAGNOSIS — H1013 Acute atopic conjunctivitis, bilateral: Secondary | ICD-10-CM

## 2016-07-06 MED ORDER — OLOPATADINE HCL 0.7 % OP SOLN: 1.0000 [drp] | Freq: Every day | OPHTHALMIC | 5 refills | Status: DC | PRN

## 2016-07-06 MED ORDER — AZELASTINE-FLUTICASONE 137-50 MCG/ACT NA SUSP: 1.0000 | Freq: Two times a day (BID) | NASAL | 5 refills | Status: DC

## 2016-07-06 MED ORDER — LEVOCETIRIZINE DIHYDROCHLORIDE 5 MG PO TABS: 5.0000 mg | ORAL_TABLET | Freq: Every evening | ORAL | 5 refills | Status: AC

## 2016-07-06 NOTE — Assessment & Plan Note (Addendum)
   Aeroallergen avoidance measures have been discussed and provided in written form.  A prescription has been provided for levocetirizine, 5mg  daily as needed.  A prescription has been provided for Dymista (azelastine/fluticasone) nasal spray, 1 spray per nostril twice daily as needed. Proper nasal spray technique has been discussed and demonstrated.  I have also recommended nasal saline spray (i.e., Simply Saline) or nasal saline lavage (i.e., NeilMed) as needed prior to medicated nasal sprays.  Initiate immunotherapy.  Medications will be decreased or discontinued as symptom relief from immunotherapy becomes evident.

## 2016-07-06 NOTE — Patient Instructions (Addendum)
Allergic rhinitis  Aeroallergen avoidance measures have been discussed and provided in written form.  A prescription has been provided for levocetirizine, 5mg  daily as needed.  A prescription has been provided for Dymista (azelastine/fluticasone) nasal spray, 1 spray per nostril twice daily as needed. Proper nasal spray technique has been discussed and demonstrated.  I have also recommended nasal saline spray (i.e., Simply Saline) or nasal saline lavage (i.e., NeilMed) as needed prior to medicated nasal sprays.  Initiate immunotherapy.  Medications will be decreased or discontinued as symptom relief from immunotherapy becomes evident.  Allergic conjunctivitis  Treatment plan as outlined above for allergic rhinitis.  A prescription has been provided for Pazeo, one drop per eye daily as needed.  I have also recommended eye lubricant drops (i.e., Natural Tears) as needed.   Return in about 6 months (around 01/03/2017), or if symptoms worsen or fail to improve.  Reducing Pollen Exposure  The American Academy of Allergy, Asthma and Immunology suggests the following steps to reduce your exposure to pollen during allergy seasons.    1. Do not hang sheets or clothing out to dry; pollen may collect on these items. 2. Do not mow lawns or spend time around freshly cut grass; mowing stirs up pollen. 3. Keep windows closed at night.  Keep car windows closed while driving. 4. Minimize morning activities outdoors, a time when pollen counts are usually at their highest. 5. Stay indoors as much as possible when pollen counts or humidity is high and on windy days when pollen tends to remain in the air longer. 6. Use air conditioning when possible.  Many air conditioners have filters that trap the pollen spores. 7. Use a HEPA room air filter to remove pollen form the indoor air you breathe.   Control of House Dust Mite Allergen  House dust mites play a major role in allergic asthma and rhinitis.   They occur in environments with high humidity wherever human skin, the food for dust mites is found. High levels have been detected in dust obtained from mattresses, pillows, carpets, upholstered furniture, bed covers, clothes and soft toys.  The principal allergen of the house dust mite is found in its feces.  A gram of dust may contain 1,000 mites and 250,000 fecal particles.  Mite antigen is easily measured in the air during house cleaning activities.    1. Encase mattresses, including the box spring, and pillow, in an air tight cover.  Seal the zipper end of the encased mattresses with wide adhesive tape. 2. Wash the bedding in water of 130 degrees Farenheit weekly.  Avoid cotton comforters/quilts and flannel bedding: the most ideal bed covering is the dacron comforter. 3. Remove all upholstered furniture from the bedroom. 4. Remove carpets, carpet padding, rugs, and non-washable window drapes from the bedroom.  Wash drapes weekly or use plastic window coverings. 5. Remove all non-washable stuffed toys from the bedroom.  Wash stuffed toys weekly. 6. Have the room cleaned frequently with a vacuum cleaner and a damp dust-mop.  The patient should not be in a room which is being cleaned and should wait 1 hour after cleaning before going into the room. 7. Close and seal all heating outlets in the bedroom.  Otherwise, the room will become filled with dust-laden air.  An electric heater can be used to heat the room. Reduce indoor humidity to less than 50%.  Do not use a humidifier.  Control of Dog or Cat Allergen  Avoidance is the best way to manage  a dog or cat allergy. If you have a dog or cat and are allergic to dog or cats, consider removing the dog or cat from the home. If you have a dog or cat but don't want to find it a new home, or if your family wants a pet even though someone in the household is allergic, here are some strategies that may help keep symptoms at bay:  1. Keep the pet out of  your bedroom and restrict it to only a few rooms. Be advised that keeping the dog or cat in only one room will not limit the allergens to that room. 2. Don't pet, hug or kiss the dog or cat; if you do, wash your hands with soap and water. 3. High-efficiency particulate air (HEPA) cleaners run continuously in a bedroom or living room can reduce allergen levels over time. 4. Regular use of a high-efficiency vacuum cleaner or a central vacuum can reduce allergen levels. 5. Giving your dog or cat a bath at least once a week can reduce airborne allergen.  Control of Mold Allergen  Mold and fungi can grow on a variety of surfaces provided certain temperature and moisture conditions exist.  Outdoor molds grow on plants, decaying vegetation and soil.  The major outdoor mold, Alternaria and Cladosporium, are found in very high numbers during hot and dry conditions.  Generally, a late Summer - Fall peak is seen for common outdoor fungal spores.  Rain will temporarily lower outdoor mold spore count, but counts rise rapidly when the rainy period ends.  The most important indoor molds are Aspergillus and Penicillium.  Dark, humid and poorly ventilated basements are ideal sites for mold growth.  The next most common sites of mold growth are the bathroom and the kitchen.  Outdoor [employer] 2. Use air conditioning and keep windows closed 3. Avoid exposure to decaying vegetation. 4. Avoid leaf raking. 5. Avoid grain handling. 6. Consider wearing a face mask if working in moldy areas.  Indoor Mold Control 1. Maintain humidity below 50%. 2. Clean washable surfaces with 5% bleach solution. 3. Remove sources e.g. Contaminated carpets.  Control of Cockroach Allergen  Cockroach allergen has been identified as an important cause of acute attacks of asthma, especially in urban settings.  There are fifty-five species of cockroach that exist in the [place], however only three, the [place], [place]  species produce allergen that can affect patients with Asthma.  Allergens can be obtained from fecal particles, egg casings and secretions from cockroaches.    1. Remove food sources. 2. Reduce access to water. 3. Seal access and entry points. 4. Spray runways with 0.5-1% Diazinon or Chlorpyrifos 5. Blow boric acid power under stoves and refrigerator. 6. Place bait stations (hydramethylnon) at feeding sites.

## 2016-07-06 NOTE — Progress Notes (Addendum)
New Patient Note  RE: Melinda Carrillo MRN: [id number] DOB: 1975-06-29 Date of Office Visit: 07/06/2016  Referring provider: No ref. provider found Primary care provider: No primary care provider on file.  Chief Complaint: Allergic Rhinitis    History of present illness: Melinda Carrillo is a 41 y.o. female presenting today for evaluation of rhinoconjunctivitis.  She is accompanied today by her mother who assists with the history.  She experiences frequent nasal and ocular symptoms, particularly triggered by pollens, which require multiple medications to control.  She had been treated at [facility] allergy and was getting ready to start immunotherapy, however due to transportation issues is transferring to our care.    Assessment and plan: Allergic rhinitis  Aeroallergen avoidance measures have been discussed and provided in written form.  A prescription has been provided for levocetirizine, 5mg  daily as needed.  A prescription has been provided for Dymista (azelastine/fluticasone) nasal spray, 1 spray per nostril twice daily as needed. Proper nasal spray technique has been discussed and demonstrated.  I have also recommended nasal saline spray (i.e., Simply Saline) or nasal saline lavage (i.e., NeilMed) as needed prior to medicated nasal sprays.  Initiate immunotherapy.  Medications will be decreased or discontinued as symptom relief from immunotherapy becomes evident.  Allergic conjunctivitis  Treatment plan as outlined above for allergic rhinitis.  A prescription has been provided for Pazeo, one drop per eye daily as needed.  I have also recommended eye lubricant drops (i.e., Natural Tears) as needed.   Meds ordered this encounter  Medications  . levocetirizine (XYZAL) 5 MG tablet    Sig: Take 1 tablet (5 mg total) by mouth every evening.    Dispense:  30 tablet    Refill:  5  . Azelastine-Fluticasone 137-50 MCG/ACT SUSP    Sig: Place 1 spray into the nose 2  (two) times daily.    Dispense:  23 g    Refill:  5  . Olopatadine HCl (PAZEO) 0.7 % SOLN    Sig: Place 1 drop into both eyes daily as needed.    Dispense:  1 Bottle    Refill:  5       Physical examination: Blood pressure 92/68, pulse 73, temperature 98.6 F (37 C), temperature source Oral, resp. rate 17, height 5' 4.5" (1.638 m), weight 152 lb (68.9 kg), SpO2 97 %, unknown if currently breastfeeding.  General: Alert, interactive, in no acute distress. HEENT: TMs pearly gray, turbinates mildly edematous without discharge, post-pharynx moderately erythematous. Neck: Supple without lymphadenopathy. Lungs: Clear to auscultation without wheezing, rhonchi or rales. CV: Normal S1, S2 without murmurs. Abdomen: Nondistended, nontender. Skin: Warm and dry, without lesions or rashes. Extremities:  No clubbing, cyanosis or edema. Neuro:   Grossly intact.  Review of systems:  Review of systems negative except as noted in HPI / PMHx or noted below: Review of Systems  Constitutional: Negative.   HENT: Negative.   Eyes: Negative.   Respiratory: Negative.   Cardiovascular: Negative.   Gastrointestinal: Negative.   Genitourinary: Negative.   Musculoskeletal: Negative.   Skin: Negative.   Neurological: Negative.   Endo/Heme/Allergies: Negative.   Psychiatric/Behavioral: Negative.     Past medical history:  Past Medical History:  Diagnosis Date  . Heart murmur   . PONV (postoperative nausea and vomiting)     Past surgical history:  Past Surgical History:  Procedure Laterality Date  . CESAREAN SECTION     x 2  . CESAREAN SECTION  05/06/2012  Procedure: CESAREAN SECTION;  Surgeon: Luz Lex, MD;  Location: [facility];  Service: Obstetrics;  Laterality: N/A;  Repeat edc 05/10/12  . TONSILLECTOMY    . TONSILLECTOMY      Family history: Family History  Problem Relation Age of Onset  . Other Mother 88    healthy  . Other Father 61    healthy  . Other Brother 73    healthy   . Allergic rhinitis Neg Hx   . Angioedema Neg Hx   . Asthma Neg Hx   . Eczema Neg Hx   . Immunodeficiency Neg Hx   . Urticaria Neg Hx     Social history: Social History   Social History  . Marital status: Married    Spouse name: N/A  . Number of children: 2  . Years of education: N/A   Occupational History  . BANKING    Social History Main Topics  . Smoking status: Never Smoker  . Smokeless tobacco: Never Used  . Alcohol use No  . Drug use: No  . Sexual activity: Not on file   Other Topics Concern  . Not on file   Social History Narrative  . No narrative on file   Environmental History: The patient lives in a house built in the 1960s with hardwood floors throughout, gas heat, and central air.  There no pets or smokers in the household.  Allergies as of 07/06/2016   No Known Allergies     Medication List       Accurate as of 07/06/16 11:59 PM. Always use your most recent med list.          Azelastine-Fluticasone 137-50 MCG/ACT Susp Place 1 spray into the nose 2 (two) times daily.   BEPREVE 1.5 % Soln Generic drug:  Bepotastine Besilate Place 1 drop into both eyes daily.   cetirizine 10 MG tablet Commonly known as:  ZYRTEC Take 10 mg by mouth daily.   levocetirizine 5 MG tablet Commonly known as:  XYZAL Take 1 tablet (5 mg total) by mouth every evening.   Olopatadine HCl 0.7 % Soln Commonly known as:  PAZEO Place 1 drop into both eyes daily as needed.   RHINOCORT ALLERGY 32 MCG/ACT nasal spray Generic drug:  budesonide Place 1 spray into both nostrils daily.       Known medication allergies: No Known Allergies  I appreciate the opportunity to take part in [place] care. Please do not hesitate to contact me with questions.  Sincerely,   R. Edgar Frisk, MD

## 2016-07-06 NOTE — Assessment & Plan Note (Signed)
   Treatment plan as outlined above for allergic rhinitis.  A prescription has been provided for Pazeo, one drop per eye daily as needed.  I have also recommended eye lubricant drops (i.e., Natural Tears) as needed. 

## 2016-07-08 IMAGING — MG MM DIAG BREAST TOMO BILATERAL
6 of 9 series · 6 of 25 positions shown · non-contrast
Comparison: Previous exam(s).

CLINICAL DATA: Patient's referring clinician reports a palpable
lump in upper outer right breast. Patient reports tenderness in this
area that waxes and wanes with her menstrual cycle.

EXAM:
DIGITAL DIAGNOSTIC BILATERAL MAMMOGRAM WITH 3D TOMOSYNTHESIS WITH
CAD
ULTRASOUND RIGHT BREAST

[R TAN]
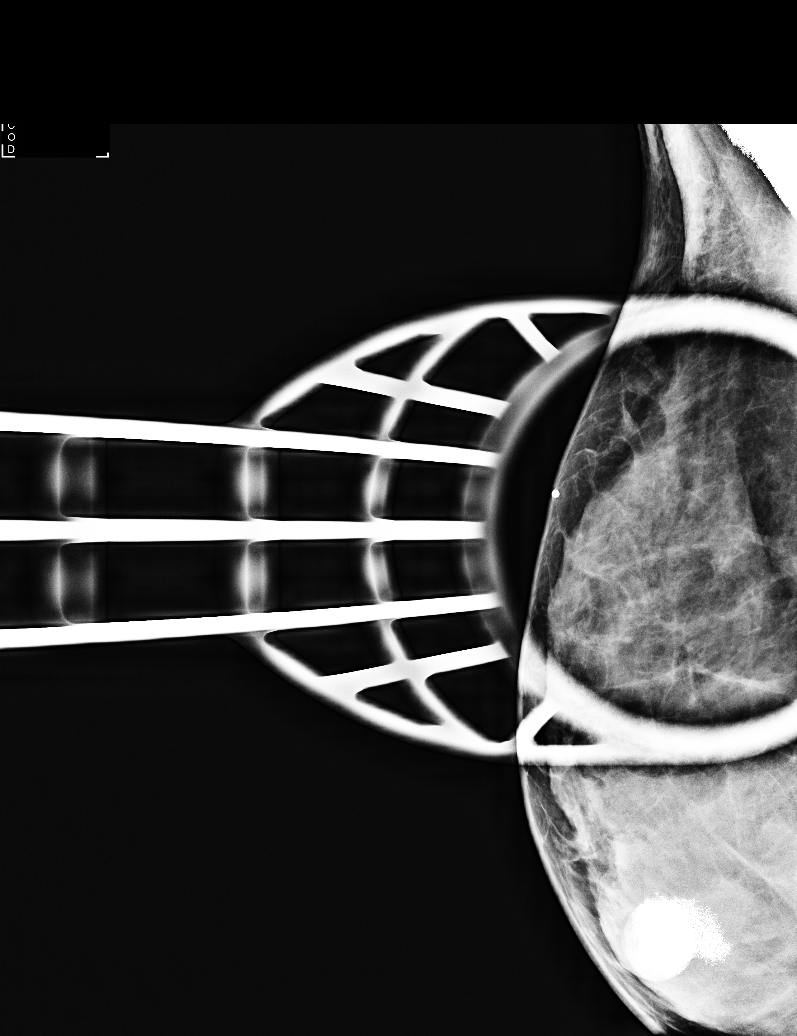

[R MLO]
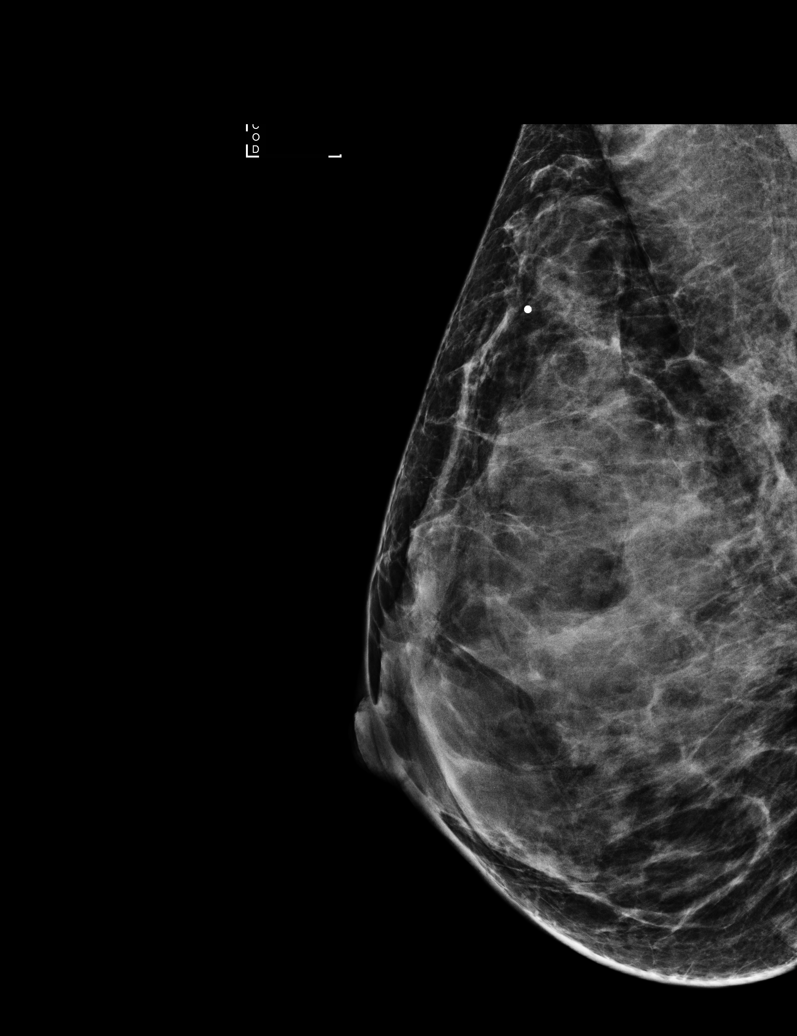

[L MLO]
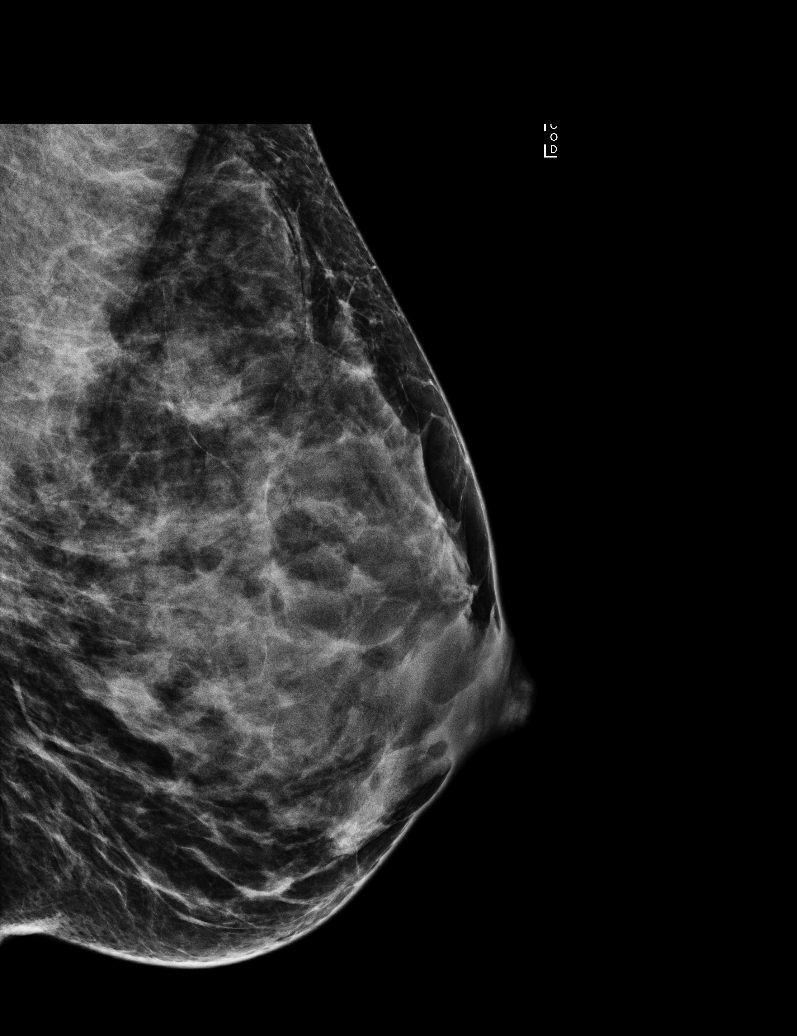

[L CC]
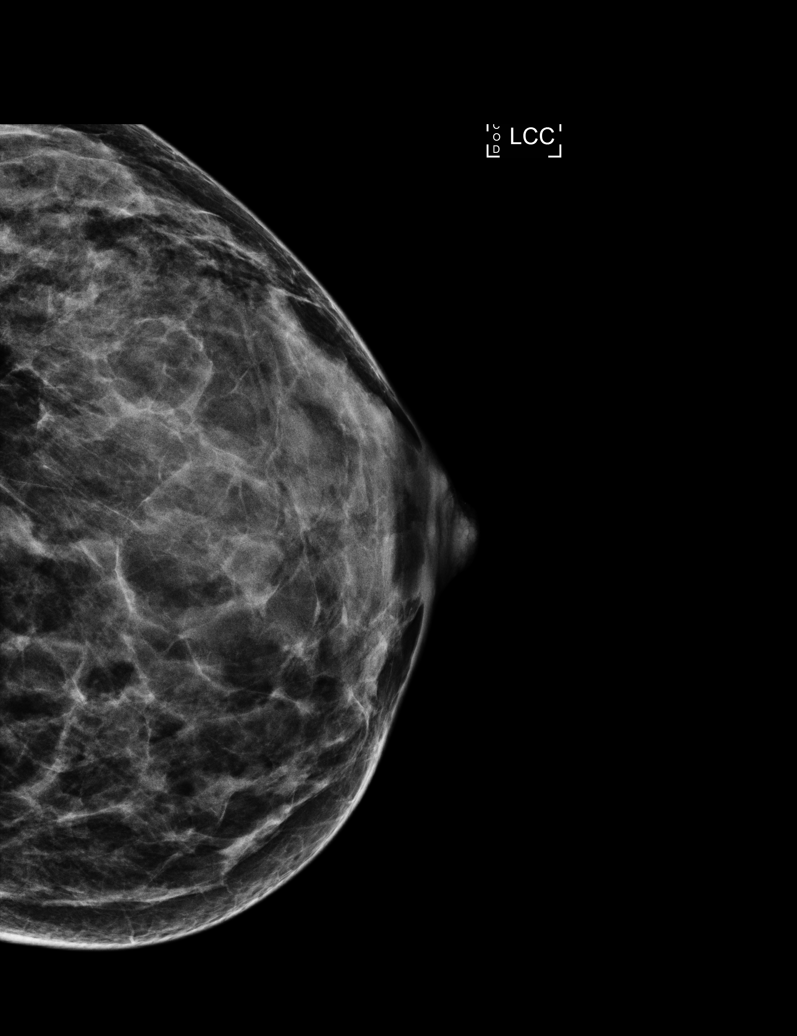

[R CC]
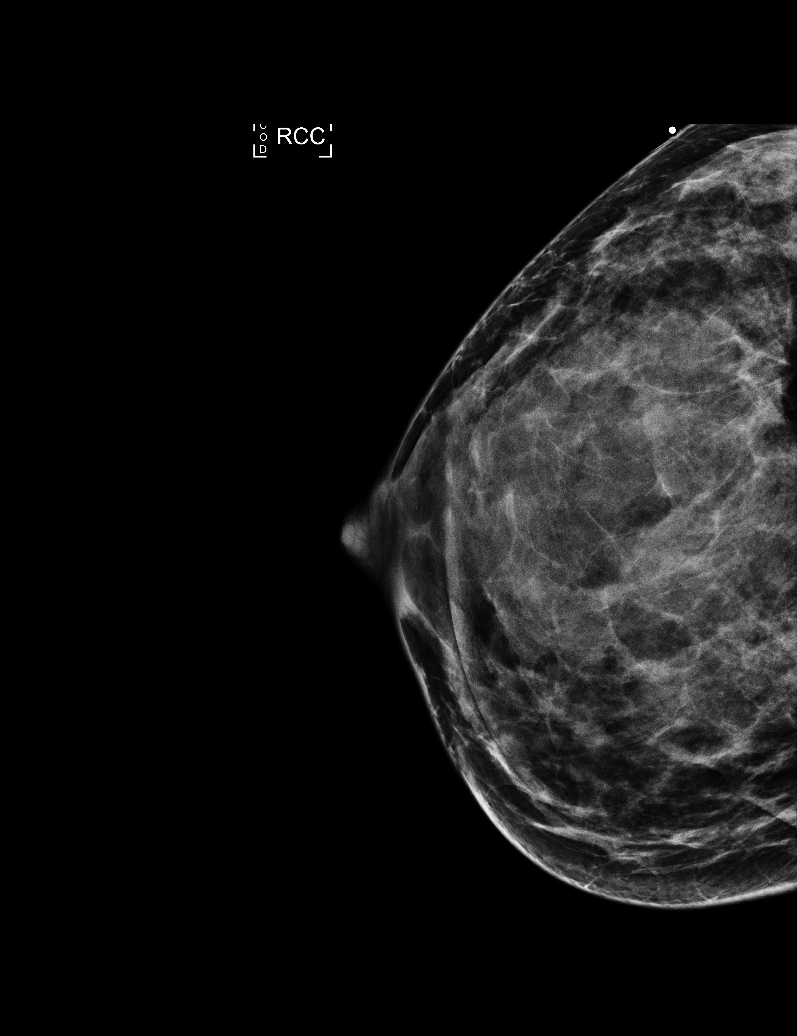

[L MLO tomo · tomo slice 26/51.0]
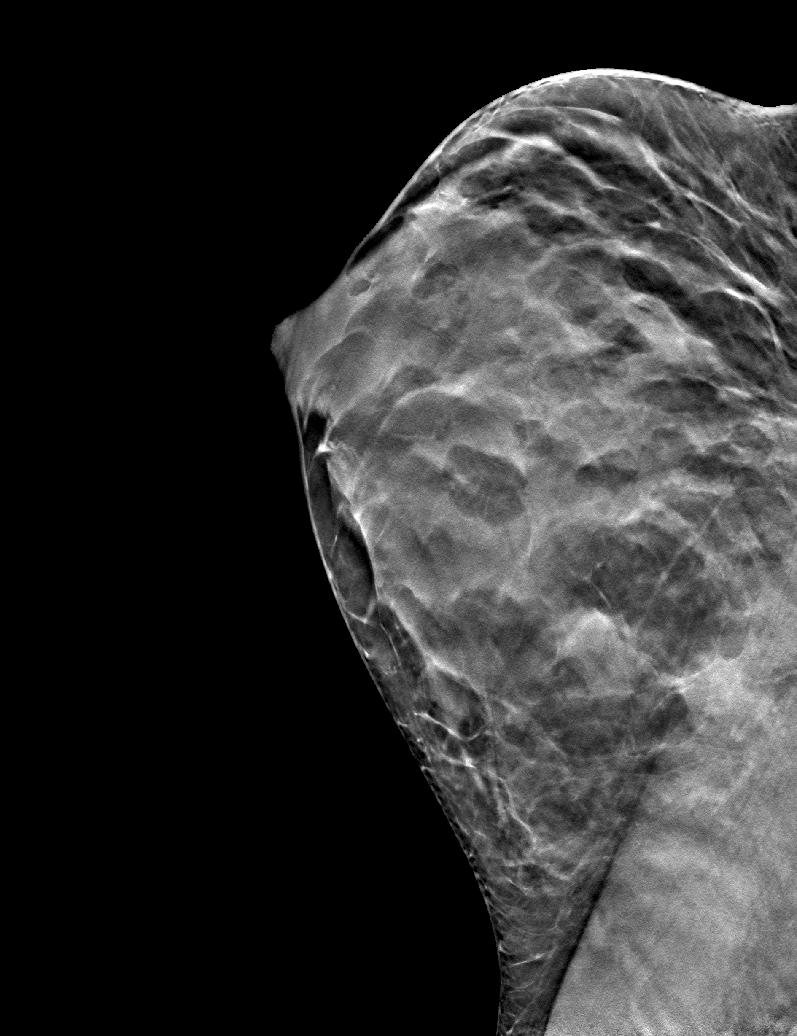

[6 of 25 positions shown; findings below may reference images not displayed]

ACR Breast Density Category c: The breast tissue is heterogeneously
dense, which may obscure small masses.
FINDINGS: There are no discrete masses, areas of architectural distortion,
areas of significant asymmetry or suspicious calcifications. No
mammographic change.

Mammographic images were processed with CAD.

On physical exam, there is a nodular texture to the upper outer
quadrant fibroglandular tissue, but no discrete mass.

Targeted ultrasound is performed, showing normal fibroglandular
tissue. No mass or cyst. No abnormal shadowing.
IMPRESSION: Normal exam.  No evidence of malignancy.

RECOMMENDATION:
Screening mammogram at age 42 unless there are persistent or
intervening clinical concerns. (Code:R4-6-N3M)

I have discussed the findings and recommendations with the patient.
Results were also provided in writing at the conclusion of the
visit. If applicable, a reminder letter will be sent to the patient
regarding the next appointment.

BI-RADS CATEGORY  1: Negative.

## 2016-07-09 ENCOUNTER — Telehealth: Payer: Self-pay

## 2016-07-09 NOTE — Telephone Encounter (Signed)
Patient spoke with insurance company and they will cover vials for her and daughter, Shira Paduano. She would like to go ahead and get new ones ordered. I did tell her that we were still waiting on grass because it was on backorder.

## 2016-07-14 NOTE — Addendum Note (Signed)
Addended by: Golda Acre C on: 07/14/2016 07:48 PM   Modules accepted: Orders

## 2016-07-16 DIAGNOSIS — J301 Allergic rhinitis due to pollen: Secondary | ICD-10-CM | POA: Diagnosis not present

## 2016-07-17 DIAGNOSIS — J3089 Other allergic rhinitis: Secondary | ICD-10-CM | POA: Diagnosis not present

## 2016-07-21 ENCOUNTER — Ambulatory Visit (INDEPENDENT_AMBULATORY_CARE_PROVIDER_SITE_OTHER): Payer: BLUE CROSS/BLUE SHIELD | Admitting: *Deleted

## 2016-07-21 DIAGNOSIS — J309 Allergic rhinitis, unspecified: Secondary | ICD-10-CM | POA: Diagnosis not present

## 2016-07-22 NOTE — Progress Notes (Signed)
Immunotherapy   Patient Details  Name: ROBENA EWY MRN: [id number] Date of Birth: [birth year]  07/22/2016  Newt Lukes Person started injections for  Grass-Weed-Tree & Mold-Mite-CR. Following schedule: A  Frequency:2 times per week Epi-Pen:Epi-Pen Available  Consent signed and patient instructions given. No problems after 30 minutes in office.    Constance Holster 07/22/2016, 8:42 AM

## 2016-08-04 ENCOUNTER — Ambulatory Visit (INDEPENDENT_AMBULATORY_CARE_PROVIDER_SITE_OTHER): Payer: BLUE CROSS/BLUE SHIELD | Admitting: *Deleted

## 2016-08-04 DIAGNOSIS — J309 Allergic rhinitis, unspecified: Secondary | ICD-10-CM | POA: Diagnosis not present

## 2016-08-10 ENCOUNTER — Ambulatory Visit (INDEPENDENT_AMBULATORY_CARE_PROVIDER_SITE_OTHER): Payer: BLUE CROSS/BLUE SHIELD | Admitting: *Deleted

## 2016-08-10 DIAGNOSIS — J309 Allergic rhinitis, unspecified: Secondary | ICD-10-CM

## 2016-08-18 ENCOUNTER — Ambulatory Visit (INDEPENDENT_AMBULATORY_CARE_PROVIDER_SITE_OTHER): Payer: BLUE CROSS/BLUE SHIELD | Admitting: *Deleted

## 2016-08-18 DIAGNOSIS — J309 Allergic rhinitis, unspecified: Secondary | ICD-10-CM | POA: Diagnosis not present

## 2016-08-21 ENCOUNTER — Ambulatory Visit (INDEPENDENT_AMBULATORY_CARE_PROVIDER_SITE_OTHER): Payer: BLUE CROSS/BLUE SHIELD | Admitting: *Deleted

## 2016-08-21 DIAGNOSIS — J309 Allergic rhinitis, unspecified: Secondary | ICD-10-CM

## 2016-09-03 ENCOUNTER — Ambulatory Visit (INDEPENDENT_AMBULATORY_CARE_PROVIDER_SITE_OTHER): Payer: BLUE CROSS/BLUE SHIELD | Admitting: *Deleted

## 2016-09-03 DIAGNOSIS — J3089 Other allergic rhinitis: Secondary | ICD-10-CM

## 2016-09-11 ENCOUNTER — Ambulatory Visit (INDEPENDENT_AMBULATORY_CARE_PROVIDER_SITE_OTHER): Payer: BLUE CROSS/BLUE SHIELD

## 2016-09-11 DIAGNOSIS — J309 Allergic rhinitis, unspecified: Secondary | ICD-10-CM

## 2016-09-15 ENCOUNTER — Ambulatory Visit (INDEPENDENT_AMBULATORY_CARE_PROVIDER_SITE_OTHER): Payer: BLUE CROSS/BLUE SHIELD

## 2016-09-15 DIAGNOSIS — J309 Allergic rhinitis, unspecified: Secondary | ICD-10-CM

## 2016-09-24 ENCOUNTER — Ambulatory Visit (INDEPENDENT_AMBULATORY_CARE_PROVIDER_SITE_OTHER): Payer: BLUE CROSS/BLUE SHIELD | Admitting: *Deleted

## 2016-09-24 DIAGNOSIS — J309 Allergic rhinitis, unspecified: Secondary | ICD-10-CM | POA: Diagnosis not present

## 2016-10-09 ENCOUNTER — Ambulatory Visit (INDEPENDENT_AMBULATORY_CARE_PROVIDER_SITE_OTHER): Payer: BLUE CROSS/BLUE SHIELD

## 2016-10-09 DIAGNOSIS — J309 Allergic rhinitis, unspecified: Secondary | ICD-10-CM

## 2016-11-10 ENCOUNTER — Ambulatory Visit: Payer: BLUE CROSS/BLUE SHIELD | Admitting: Allergy and Immunology

## 2017-12-09 ENCOUNTER — Encounter: Payer: Self-pay | Admitting: Gastroenterology

## 2018-01-06 ENCOUNTER — Ambulatory Visit: Payer: BLUE CROSS/BLUE SHIELD | Admitting: Gastroenterology

## 2018-01-06 ENCOUNTER — Encounter: Payer: Self-pay | Admitting: Gastroenterology

## 2018-01-06 VITALS — BP 90/60 | HR 64 | Ht 64.0 in | Wt 153.6 lb

## 2018-01-06 DIAGNOSIS — Z800 Family history of malignant neoplasm of digestive organs: Secondary | ICD-10-CM | POA: Insufficient documentation

## 2018-01-06 NOTE — Progress Notes (Signed)
01/06/2018 PAMLEA FINDER [id number] July 30, 1975   HISTORY OF PRESENT ILLNESS:  This is a 42 year old female who is new to our office.  She is here today to discuss colonoscopy.  Says that she has a family history of colon cancer in her MGF, he was diagnosed with colon cancer at age 57.  Her mother has colon polyps as well but she is 40 years old at this point.  She denies any GI complaints.  She is requesting Dr. Silverio Decamp to be her established GI MD.   Past Medical History:  Diagnosis Date  . Basal cell carcinoma 2011   forehead  . Heart murmur   . Mitral valve prolapse   . PONV (postoperative nausea and vomiting)   . Squamous cell skin cancer    Past Surgical History:  Procedure Laterality Date  . CESAREAN SECTION     x 3  . CESAREAN SECTION  05/06/2012   Procedure: CESAREAN SECTION;  Surgeon: Luz Lex, MD;  Location: [facility];  Service: Obstetrics;  Laterality: N/A;  Repeat edc 05/10/12  . SKIN CANCER EXCISION     multiple  . TONSILLECTOMY      reports that she has never smoked. She has never used smokeless tobacco. She reports that she drinks alcohol. She reports that she does not use drugs. family history includes Colon cancer (age of onset: 1) in her maternal grandfather; Colon polyps in her maternal grandfather and mother; Irritable bowel syndrome in her mother; Other (age of onset: 61) in her brother; Other (age of onset: 51) in her father. No Known Allergies    Outpatient Encounter Medications as of 01/06/2018  Medication Sig  . Azelastine-Fluticasone 137-50 MCG/ACT SUSP Place 1 spray into the nose 2 (two) times daily. (Patient taking differently: Place 1 spray into the nose 2 (two) times daily as needed. )  . Bepotastine Besilate (BEPREVE) 1.5 % SOLN Place 1 drop into both eyes daily as needed.   . budesonide (RHINOCORT ALLERGY) 32 MCG/ACT nasal spray Place 1 spray into both nostrils daily as needed.   . cetirizine (ZYRTEC) 10 MG tablet Take 10 mg by mouth  daily as needed.   Marland Kitchen levocetirizine (XYZAL) 5 MG tablet Take 1 tablet (5 mg total) by mouth every evening. (Patient taking differently: Take 5 mg by mouth daily as needed. )  . Olopatadine HCl (PAZEO) 0.7 % SOLN Place 1 drop into both eyes daily as needed.   No facility-administered encounter medications on file as of 01/06/2018.      REVIEW OF SYSTEMS  : All other systems reviewed and negative except where noted in the History of Present Illness.   PHYSICAL EXAM: BP 90/60   Pulse 64   Ht 5\' 4"  (1.626 m)   Wt 153 lb 9.6 oz (69.7 kg)   BMI 26.37 kg/m  General: Well developed white female in no acute distress Head: Normocephalic and atraumatic Eyes:  Sclerae anicteric, conjunctiva pink. Ears: Normal auditory acuity Lungs: Clear throughout to auscultation; no increased WOB. Heart: Regular rate and rhythm; no M/R/G. Abdomen: Soft, non-distended.  BS present.  Non-tender. Musculoskeletal: Symmetrical with no gross deformities  Skin: No lesions on visible extremities Extremities: No edema  Neurological: Alert oriented x 4, grossly non-focal Psychological:  Alert and cooperative. Normal mood and affect  ASSESSMENT AND PLAN: *Family history of colon cancer in her MGF:  He was diagnosed with colon cancer at age 19.  Her mother has colon polyps as well but  she is 40 years old at this point.  Discussed that colonoscopy not necessarily recommended at age 63 with this particular history and may not be covered by insurance but we could certainly proceed if she desires to do so.  She is going to check with her insurance company and think about it.  Will call back if she decides to proceed.  She has no GI complaints.  She is requesting Dr. Silverio Decamp to be her established GI MD.   CC:  No ref. provider found

## 2018-01-06 NOTE — Patient Instructions (Signed)
Call back if you decide to have colonoscopy.

## 2018-01-17 NOTE — Progress Notes (Signed)
Reviewed and agree with documentation and assessment and plan. K. Veena Rajvir Ernster , MD   

## 2018-05-16 DIAGNOSIS — M2629 Other anomalies of dental arch relationship: Secondary | ICD-10-CM | POA: Insufficient documentation

## 2019-05-03 ENCOUNTER — Other Ambulatory Visit: Payer: BLUE CROSS/BLUE SHIELD

## 2019-06-02 ENCOUNTER — Ambulatory Visit: Payer: BC Managed Care – PPO | Attending: Internal Medicine

## 2019-06-02 DIAGNOSIS — Z20822 Contact with and (suspected) exposure to covid-19: Secondary | ICD-10-CM

## 2019-06-03 LAB — NOVEL CORONAVIRUS, NAA: SARS-CoV-2, NAA: NOT DETECTED

## 2019-10-03 DIAGNOSIS — C4491 Basal cell carcinoma of skin, unspecified: Secondary | ICD-10-CM | POA: Insufficient documentation

## 2019-10-10 ENCOUNTER — Other Ambulatory Visit: Payer: Self-pay | Admitting: Obstetrics and Gynecology

## 2019-10-10 DIAGNOSIS — N6320 Unspecified lump in the left breast, unspecified quadrant: Secondary | ICD-10-CM

## 2019-10-19 ENCOUNTER — Other Ambulatory Visit: Payer: Self-pay

## 2019-10-19 ENCOUNTER — Ambulatory Visit
Admission: RE | Admit: 2019-10-19 | Discharge: 2019-10-19 | Disposition: A | Discharge status: Home / Self Care | Payer: BC Managed Care – PPO | Source: Ambulatory Visit | Attending: Obstetrics and Gynecology | Admitting: Obstetrics and Gynecology

## 2019-10-19 DIAGNOSIS — N6320 Unspecified lump in the left breast, unspecified quadrant: Secondary | ICD-10-CM

## 2019-10-29 DIAGNOSIS — J209 Acute bronchitis, unspecified: Secondary | ICD-10-CM | POA: Insufficient documentation

## 2019-11-20 ENCOUNTER — Telehealth: Payer: Self-pay

## 2019-11-20 NOTE — Telephone Encounter (Signed)
NOTES ON FILE FROM [facility] [phone] SENT REFERRAL TO SCHEDULING

## 2019-11-27 ENCOUNTER — Encounter: Payer: Self-pay | Admitting: Family Medicine

## 2020-01-02 ENCOUNTER — Ambulatory Visit: Payer: BC Managed Care – PPO | Admitting: Interventional Cardiology

## 2020-02-02 NOTE — Progress Notes (Signed)
Cardiology Office Note:    Date:  02/05/2020   ID:  Melinda Carrillo, DOB 02-May-1976, MRN [id number]  PCP:  Chesley Noon, MD  [facility] Cardiologist:  Larae Grooms, MD  [facility] Electrophysiologist:  None   Referring MD: Chesley Noon, MD     History of Present Illness:    Melinda Carrillo is a 44 y.o. female with a hx of heart murmur with TTE in 2012 revealing mild AI, trace MR, no MVP who presents to clinic for further evaluation.  The patient was told that she had a heart murmur in high school. Saw Dr. Kirk Ruths in 2012 where work-up including TTE was reassuring with normal LV function, mild AI, no MVP, trace MR. She is asymptomatic and very active. No chest pain, SOB, DOE, exercise intolerance, palpitations, or LE edema. Exercises 6 days per week without issues.  Married with 3 children. Up-to-date on all routine health screening with PCP/gynecologist. On mirena IUD for birth control.   Labs 11/10/19: HgB 12.7, Cr 0.58, K 4.6, ALT 12, AST 15, TSH 0.853  Family history: Father with HTN. Otherwise no coronary disease.   Past Medical History:  Diagnosis Date   Allergic rhinitis    Apertognathia    Basal cell carcinoma 2011   forehead   Family history of colon cancer    Heart murmur    Mitral valve prolapse    PONV (postoperative nausea and vomiting)    Squamous cell skin cancer     Past Surgical History:  Procedure Laterality Date   CESAREAN SECTION     x 3   CESAREAN SECTION  05/06/2012   Procedure: CESAREAN SECTION;  Surgeon: Luz Lex, MD;  Location: [facility];  Service: Obstetrics;  Laterality: N/A;  Repeat edc 05/10/12   SKIN CANCER EXCISION     multiple   TONSILLECTOMY      Current Medications: Current Meds  Medication Sig   Azelastine-Fluticasone 137-50 MCG/ACT SUSP Place 1 spray into the nose 2 (two) times daily.   Bepotastine Besilate (BEPREVE) 1.5 % SOLN Place 1 drop into both eyes daily as  needed.    budesonide (RHINOCORT ALLERGY) 32 MCG/ACT nasal spray Place 1 spray into both nostrils daily as needed.    cetirizine (ZYRTEC) 10 MG tablet Take 10 mg by mouth daily as needed.    levocetirizine (XYZAL) 5 MG tablet Take 1 tablet (5 mg total) by mouth every evening.   Olopatadine HCl (PAZEO) 0.7 % SOLN Place 1 drop into both eyes daily as needed.     Allergies:   Patient has no known allergies.   Social History   Socioeconomic History   Marital status: Married    Spouse name: Not on file   Number of children: 2   Years of education: Not on file   Highest education level: Not on file  Occupational History   Occupation: BANKING  Tobacco Use   Smoking status: Never Smoker   Smokeless tobacco: Never Used  Substance and Sexual Activity   Alcohol use: Yes    Comment: 7 drinks weekly   Drug use: No   Sexual activity: Not on file  Other Topics Concern   Not on file  Social History Narrative   Not on file   Social Determinants of Health   Financial Resource Strain:    Difficulty of Paying Living Expenses: Not on file  Food Insecurity:    Worried About [place] in the Last Year: Not on  file   [place] in the Last Year: Not on file  Transportation Needs:    Lack of Transportation (Medical): Not on file   Lack of Transportation (Non-Medical): Not on file  Physical Activity:    Days of Exercise per Week: Not on file   Minutes of Exercise per Session: Not on file  Stress:    Feeling of Stress : Not on file  Social Connections:    Frequency of Communication with Friends and Family: Not on file   Frequency of Social Gatherings with Friends and Family: Not on file   Attends Religious Services: Not on file   Active Member of Clubs or Organizations: Not on file   Attends Archivist Meetings: Not on file   Marital Status: Not on file     Family History: The patient's family history includes Colon cancer (age of  onset: 66) in her maternal grandfather; Colon polyps in her maternal grandfather and mother; Irritable bowel syndrome in her mother; Other (age of onset: 61) in her brother; Other (age of onset: 24) in her father. There is no history of Allergic rhinitis, Angioedema, Asthma, Eczema, Immunodeficiency, or Urticaria.  ROS:   Please see the history of present illness.    The patient denies chest pain, chest pressure, dyspnea at rest or with exertion, palpitations, PND, orthopnea, or leg swelling. Denies cough, fever, chills. Denies nausea, vomiting. Denies syncope or presyncope. Denies dizziness or lightheadedness. Denies snoring.   EKGs/Labs/Other Studies Reviewed:    The following studies were reviewed today:  TTE 2012: - Left ventricle: The cavity size was normal. Wall thickness  was normal. The estimated ejection fraction was 60%. Wall  motion was normal; there were no regional wall motion  abnormalities.  - Aortic valve: The valve is tricuspid. There is very mild  AI from the commissure between the right and left cusps.  - Mitral valve: There is flat closure. There is no prolapse.  There is trace MR.    EKG:  EKG is ordered today.  The ekg ordered today demonstrates NSR with HR 71.  Recent Labs: Labs 11/10/19: HgB 12.7, Cr 0.58, K 4.6, ALT 12, AST 15, TSH 0.853  Recent Lipid Panel No results found for: CHOL, TRIG, HDL, CHOLHDL, VLDL, LDLCALC, LDLDIRECT  Physical Exam:    VS:  BP 110/70    Pulse 71    Ht 5\' 4"  (1.626 m)    Wt 132 lb 9.6 oz (60.1 kg)    BMI 22.76 kg/m     Wt Readings from Last 3 Encounters:  02/05/20 132 lb 9.6 oz (60.1 kg)  01/06/18 153 lb 9.6 oz (69.7 kg)  07/06/16 152 lb (68.9 kg)     GEN:  Well nourished, well developed in no acute distress HEENT: Normal NECK: No JVD; No carotid bruits LYMPHATICS: No lymphadenopathy CARDIAC: RRR, 2/6 systolic murmur heard throughout mid-systole. No click. RESPIRATORY:  Clear to auscultation without rales,  wheezing or rhonchi  ABDOMEN: Soft, non-tender, non-distended MUSCULOSKELETAL:  No edema; No deformity  SKIN: Warm and dry NEUROLOGIC:  Alert and oriented x 3 PSYCHIATRIC:  Normal affect   ASSESSMENT:    1. Murmur   2. Mild mitral and aortic regurgitation    PLAN:    In order of problems listed above:  #Mild Aortic Regurgitation #Trace MR: Last TTE 2012 with mild AI and trace MR. No MVP. LVEF normal.  Asymptomatic and very healthy.  -Repeat TTE for routine screening of AI/MR -Needs repeat TTE  every 3-5 years for surveillance -Patient asymptomatic and very active and low CV risk  Medication Adjustments/Labs and Tests Ordered: Current medicines are reviewed at length with the patient today.  Concerns regarding medicines are outlined above.  Orders Placed This Encounter  Procedures   EKG 12-Lead   ECHOCARDIOGRAM COMPLETE   No orders of the defined types were placed in this encounter.   Patient Instructions  Medication Instructions:  Your physician recommends that you continue on your current medications as directed. Please refer to the Current Medication list given to you today.  *If you need a refill on your cardiac medications before your next appointment, please call your pharmacy*   Lab Work: None ordered  If you have labs (blood work) drawn today and your tests are completely normal, you will receive your results only by:  [place] (if you have MyChart) OR  A paper copy in the mail If you have any lab test that is abnormal or we need to change your treatment, we will call you to review the results.   Testing/Procedures: Your physician has requested that you have an echocardiogram. Echocardiography is a painless test that uses sound waves to create images of your heart. It provides your Carrillo with information about the size and shape of your heart and how well your hearts chambers and valves are working. This procedure takes approximately one hour.  There are no restrictions for this procedure.    Follow-Up: At [facility], you and your health needs are our priority.  As part of our continuing mission to provide you with exceptional heart care, we have created designated Provider Care Teams.  These Care Teams include your primary Cardiologist (physician) and Advanced Practice Providers (APPs -  Physician Assistants and Nurse Practitioners) who all work together to provide you with the care you need, when you need it.  We recommend signing up for the patient portal called "MyChart".  Sign up information is provided on this After Visit Summary.  MyChart is used to connect with patients for Virtual Visits (Telemedicine).  Patients are able to view lab/test results, encounter notes, upcoming appointments, etc.  Non-urgent messages can be sent to your provider as well.   To learn more about what you can do with MyChart, go to [email or web address].    Your next appointment:   AS NEEDED  The format for your next appointment:   In Person  Provider:   Gwyndolyn Kaufman, MD   Other Instructions  Echocardiogram An echocardiogram is a procedure that uses painless sound waves (ultrasound) to produce an image of the heart. Images from an echocardiogram can provide important information about:  Signs of coronary artery disease (CAD).  Aneurysm detection. An aneurysm is a weak or damaged part of an artery wall that bulges out from the normal force of blood pumping through the body.  Heart size and shape. Changes in the size or shape of the heart can be associated with certain conditions, including heart failure, aneurysm, and CAD.  Heart muscle function.  Heart valve function.  Signs of a past heart attack.  Fluid buildup around the heart.  Thickening of the heart muscle.  A tumor or infectious growth around the heart valves. Tell a health care provider about:  Any allergies you have.  All medicines you are taking, including  vitamins, herbs, eye drops, creams, and over-the-counter medicines.  Any blood disorders you have.  Any surgeries you have had.  Any medical conditions you have.  Whether you are  pregnant or may be pregnant. What are the risks? Generally, this is a safe procedure. However, problems may occur, including:  Allergic reaction to dye (contrast) that may be used during the procedure. What happens before the procedure? No specific preparation is needed. You may eat and drink normally. What happens during the procedure?   An IV tube may be inserted into one of your veins.  You may receive contrast through this tube. A contrast is an injection that improves the quality of the pictures from your heart.  A gel will be applied to your chest.  A wand-like tool (transducer) will be moved over your chest. The gel will help to transmit the sound waves from the transducer.  The sound waves will harmlessly bounce off of your heart to allow the heart images to be captured in real-time motion. The images will be recorded on a computer. The procedure may vary among health care providers and hospitals. What happens after the procedure?  You may return to your normal, everyday life, including diet, activities, and medicines, unless your health care provider tells you not to do that. Summary  An echocardiogram is a procedure that uses painless sound waves (ultrasound) to produce an image of the heart.  Images from an echocardiogram can provide important information about the size and shape of your heart, heart muscle function, heart valve function, and fluid buildup around your heart.  You do not need to do anything to prepare before this procedure. You may eat and drink normally.  After the echocardiogram is completed, you may return to your normal, everyday life, unless your health care provider tells you not to do that. This information is not intended to replace advice given to you by your health  care provider. Make sure you discuss any questions you have with your health care provider. Document Revised: 08/18/2018 Document Reviewed: 05/30/2016 Elsevier Patient Education  2020 [employer].      Signed, Freada Bergeron, MD  02/05/2020 9:10 AM    [place]

## 2020-02-05 ENCOUNTER — Ambulatory Visit: Payer: BC Managed Care – PPO | Admitting: Cardiology

## 2020-02-05 ENCOUNTER — Encounter: Payer: Self-pay | Admitting: Cardiology

## 2020-02-05 ENCOUNTER — Other Ambulatory Visit: Payer: Self-pay

## 2020-02-05 VITALS — BP 110/70 | HR 71 | Ht 64.0 in | Wt 132.6 lb

## 2020-02-05 DIAGNOSIS — I080 Rheumatic disorders of both mitral and aortic valves: Secondary | ICD-10-CM

## 2020-02-05 DIAGNOSIS — R011 Cardiac murmur, unspecified: Secondary | ICD-10-CM | POA: Diagnosis not present

## 2020-02-05 NOTE — Patient Instructions (Addendum)
Medication Instructions:  Your physician recommends that you continue on your current medications as directed. Please refer to the Current Medication list given to you today.  *If you need a refill on your cardiac medications before your next appointment, please call your pharmacy*   Lab Work: None ordered  If you have labs (blood work) drawn today and your tests are completely normal, you will receive your results only by: [place] MyChart Message (if you have MyChart) OR . A paper copy in the mail If you have any lab test that is abnormal or we need to change your treatment, we will call you to review the results.   Testing/Procedures: Your physician has requested that you have an echocardiogram. Echocardiography is a painless test that uses sound waves to create images of your heart. It provides your doctor with information about the size and shape of your heart and how well your heart's chambers and valves are working. This procedure takes approximately one hour. There are no restrictions for this procedure.    Follow-Up: At [facility], you and your health needs are our priority.  As part of our continuing mission to provide you with exceptional heart care, we have created designated Provider Care Teams.  These Care Teams include your primary Cardiologist (physician) and Advanced Practice Providers (APPs -  Physician Assistants and Nurse Practitioners) who all work together to provide you with the care you need, when you need it.  We recommend signing up for the patient portal called "MyChart".  Sign up information is provided on this After Visit Summary.  MyChart is used to connect with patients for Virtual Visits (Telemedicine).  Patients are able to view lab/test results, encounter notes, upcoming appointments, etc.  Non-urgent messages can be sent to your provider as well.   To learn more about what you can do with MyChart, go to [email or web address].    Your next appointment:   AS  NEEDED  The format for your next appointment:   In Person  Provider:   Gwyndolyn Kaufman, MD   Other Instructions  Echocardiogram An echocardiogram is a procedure that uses painless sound waves (ultrasound) to produce an image of the heart. Images from an echocardiogram can provide important information about:  Signs of coronary artery disease (CAD).  Aneurysm detection. An aneurysm is a weak or damaged part of an artery wall that bulges out from the normal force of blood pumping through the body.  Heart size and shape. Changes in the size or shape of the heart can be associated with certain conditions, including heart failure, aneurysm, and CAD.  Heart muscle function.  Heart valve function.  Signs of a past heart attack.  Fluid buildup around the heart.  Thickening of the heart muscle.  A tumor or infectious growth around the heart valves. Tell a health care provider about:  Any allergies you have.  All medicines you are taking, including vitamins, herbs, eye drops, creams, and over-the-counter medicines.  Any blood disorders you have.  Any surgeries you have had.  Any medical conditions you have.  Whether you are pregnant or may be pregnant. What are the risks? Generally, this is a safe procedure. However, problems may occur, including:  Allergic reaction to dye (contrast) that may be used during the procedure. What happens before the procedure? No specific preparation is needed. You may eat and drink normally. What happens during the procedure?   An IV tube may be inserted into one of your veins.  You may receive  contrast through this tube. A contrast is an injection that improves the quality of the pictures from your heart.  A gel will be applied to your chest.  A wand-like tool (transducer) will be moved over your chest. The gel will help to transmit the sound waves from the transducer.  The sound waves will harmlessly bounce off of your heart to  allow the heart images to be captured in real-time motion. The images will be recorded on a computer. The procedure may vary among health care providers and hospitals. What happens after the procedure?  You may return to your normal, everyday life, including diet, activities, and medicines, unless your health care provider tells you not to do that. Summary  An echocardiogram is a procedure that uses painless sound waves (ultrasound) to produce an image of the heart.  Images from an echocardiogram can provide important information about the size and shape of your heart, heart muscle function, heart valve function, and fluid buildup around your heart.  You do not need to do anything to prepare before this procedure. You may eat and drink normally.  After the echocardiogram is completed, you may return to your normal, everyday life, unless your health care provider tells you not to do that. This information is not intended to replace advice given to you by your health care provider. Make sure you discuss any questions you have with your health care provider. Document Revised: 08/18/2018 Document Reviewed: 05/30/2016 Elsevier Patient Education  2020 [employer] Inc[employer]   

## 2020-02-06 ENCOUNTER — Ambulatory Visit: Payer: BC Managed Care – PPO | Admitting: Internal Medicine

## 2020-02-22 ENCOUNTER — Telehealth: Payer: Self-pay | Admitting: Cardiology

## 2020-02-22 ENCOUNTER — Other Ambulatory Visit: Payer: Self-pay

## 2020-02-22 ENCOUNTER — Ambulatory Visit (HOSPITAL_COMMUNITY): Payer: BC Managed Care – PPO | Attending: Cardiology

## 2020-02-22 DIAGNOSIS — R011 Cardiac murmur, unspecified: Secondary | ICD-10-CM | POA: Diagnosis not present

## 2020-02-22 DIAGNOSIS — I080 Rheumatic disorders of both mitral and aortic valves: Secondary | ICD-10-CM | POA: Diagnosis not present

## 2020-02-22 LAB — ECHOCARDIOGRAM COMPLETE
Area-P 1/2: 2.55 cm2
P 1/2 time: 462 msec
S' Lateral: 3.05 cm

## 2020-02-22 NOTE — Telephone Encounter (Signed)
Spoke to the patient about her echo results. TTE showed normal LVEF, mild MR, mild AR.   Will continue to watch AR, MR every 3-5 years.  Gwyndolyn Kaufman, MD

## 2020-11-05 IMAGING — MG MM DIGITAL DIAGNOSTIC UNILAT*L* W/ TOMO W/ CAD
2 series · 3 of 6 positions shown · non-contrast
Comparison: Previous exam(s).

CLINICAL DATA: 44-year-old patient recently had a negative
possible lump was palpated in 2 o'clock region of the left breast.
Patient states in this region of her breast she has fluctuating
lumpy areas with her menstrual cycles.

EXAM:
DIGITAL DIAGNOSTIC LEFT MAMMOGRAM WITH CAD AND TOMO
ULTRASOUND LEFT BREAST

[L TAN synth-2D]
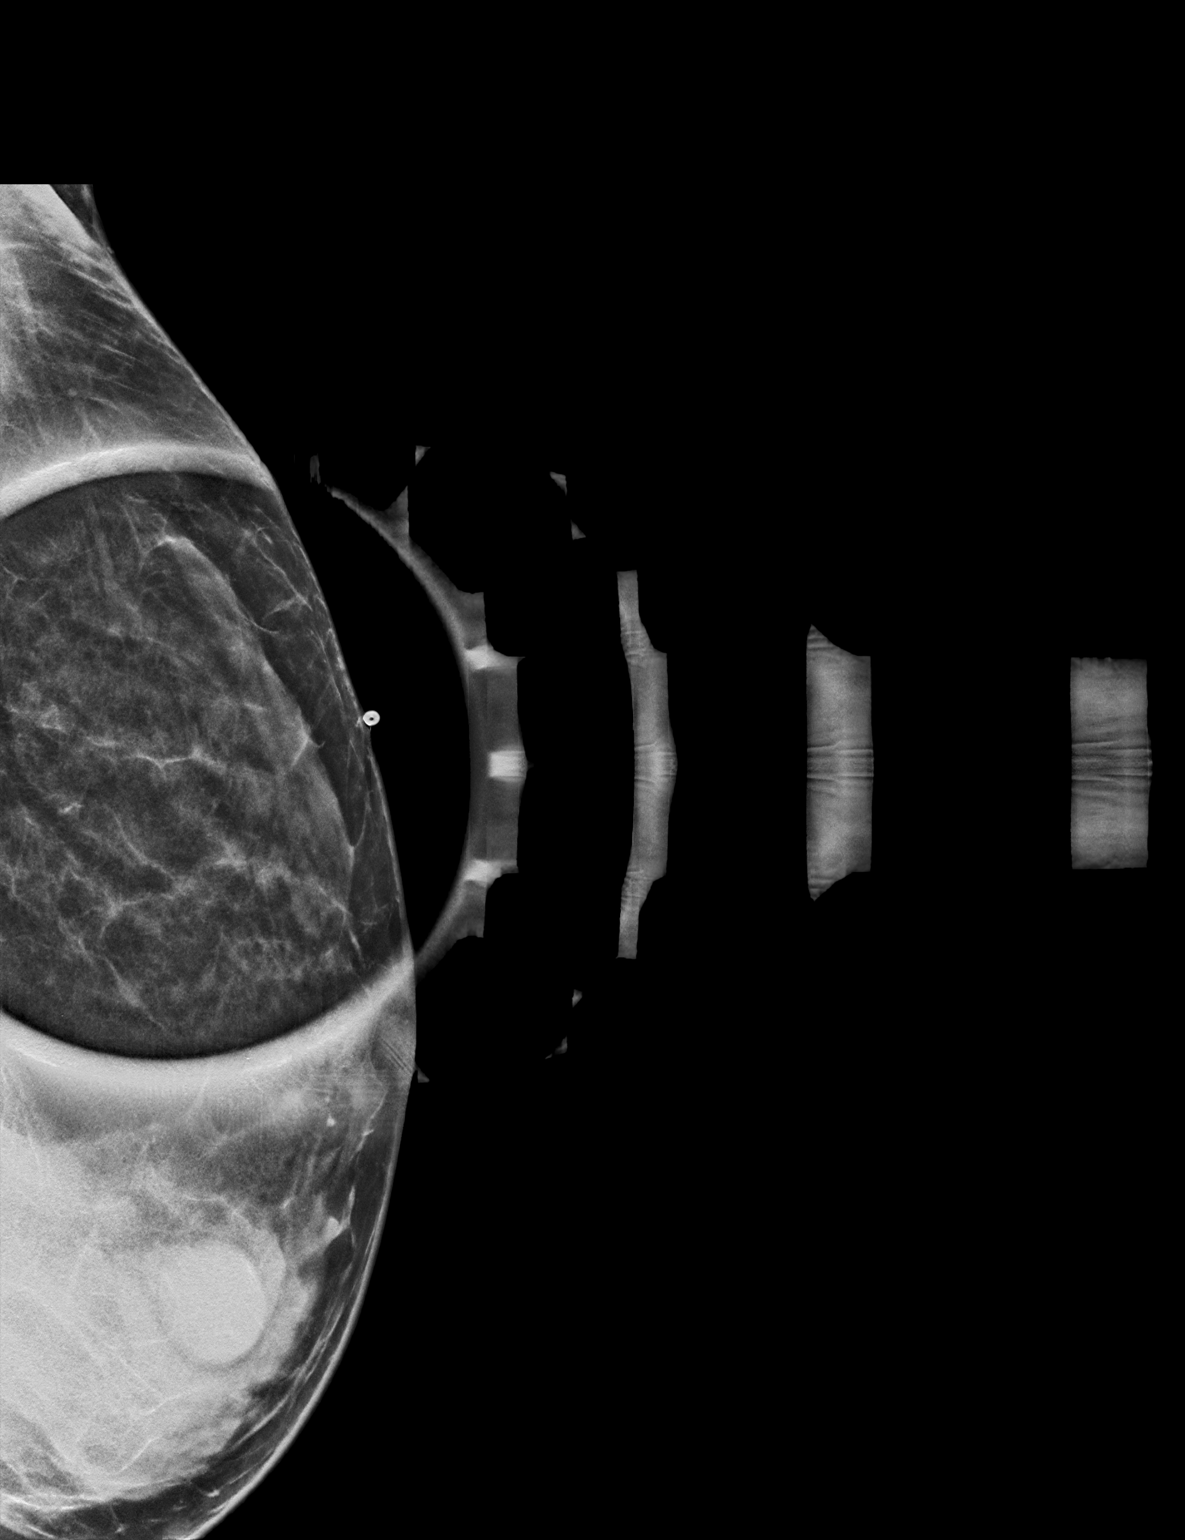

[L TAN tomo · 2 of 43 frames shown]
[frame 14/43]
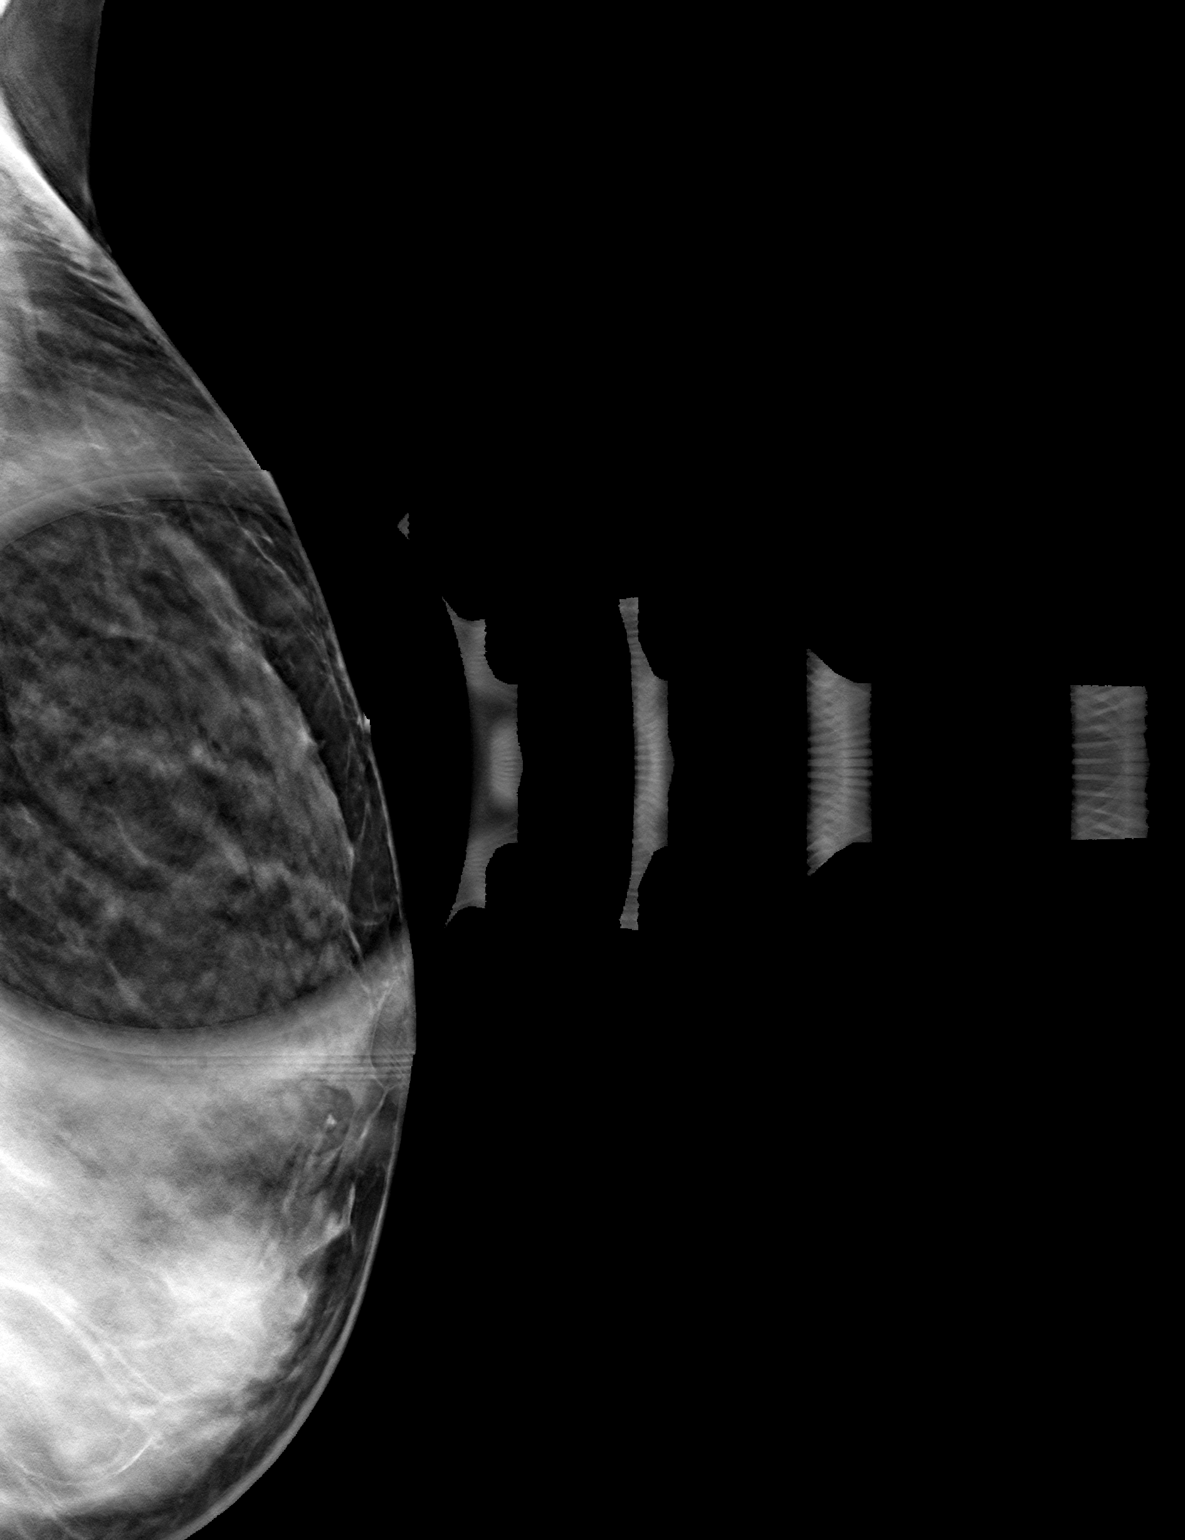
[frame 22/43]
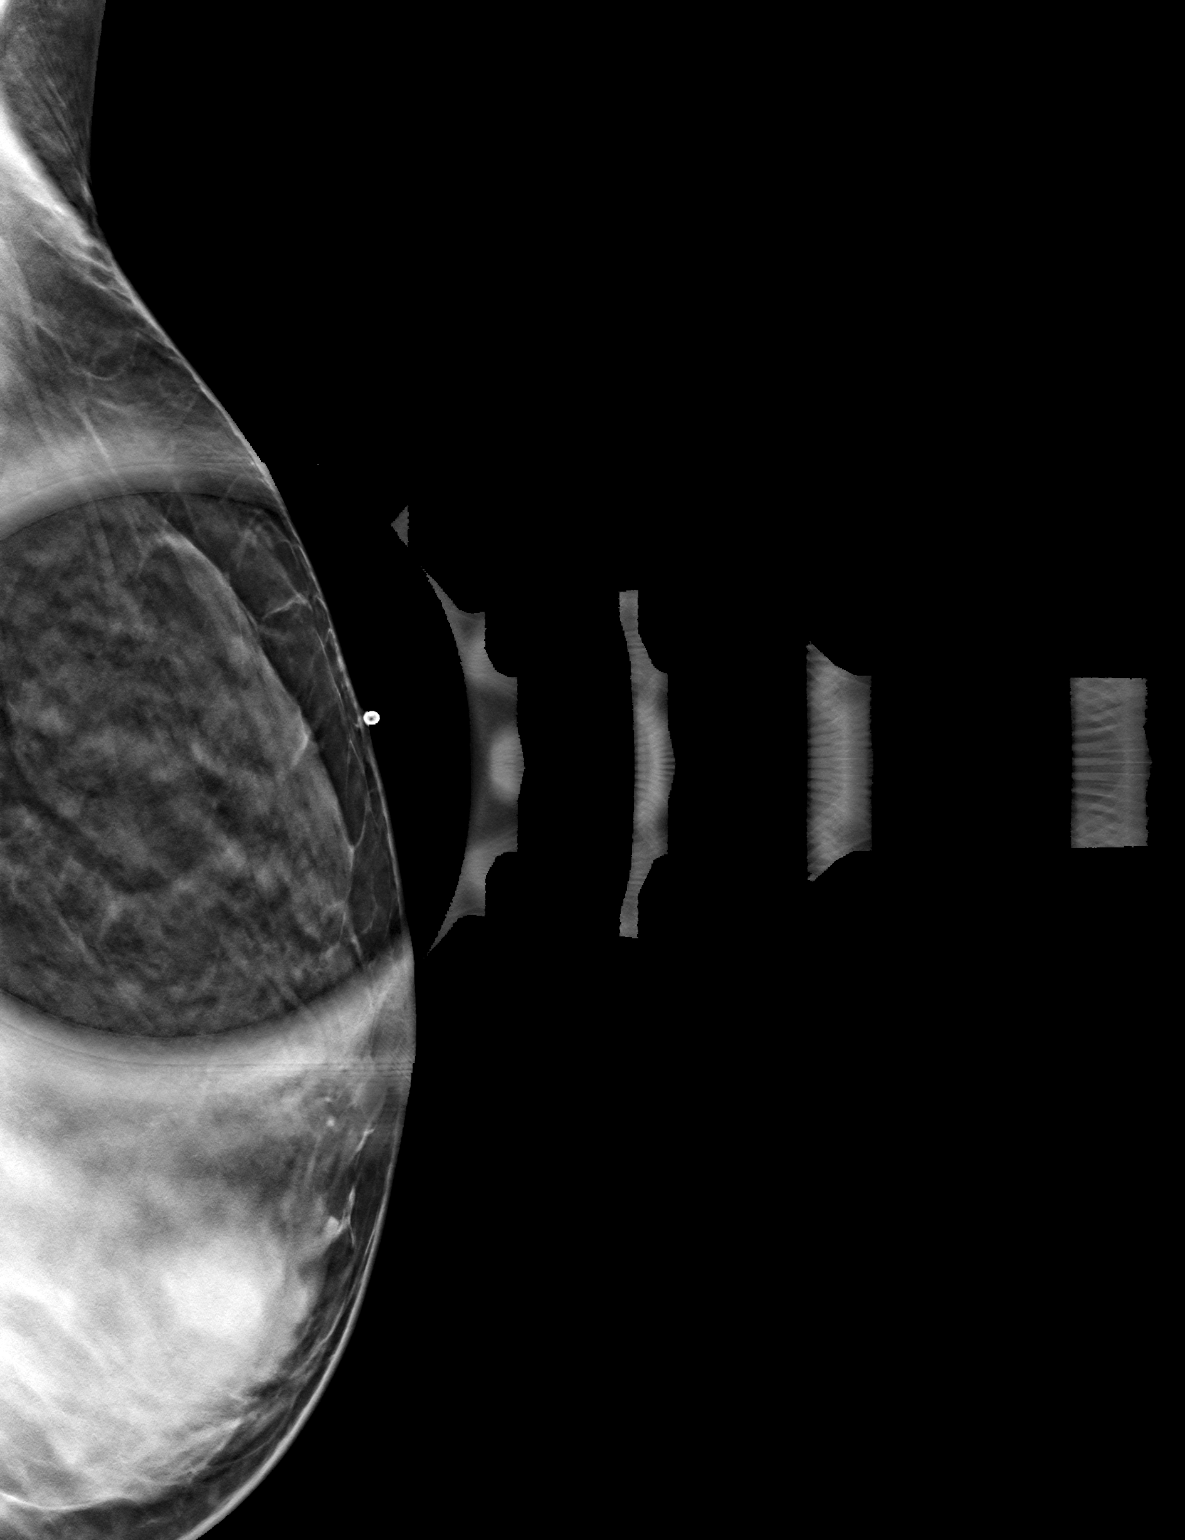

[3 of 6 positions shown; findings below may reference images not displayed]

ACR Breast Density Category c: The breast tissue is heterogeneously
dense, which may obscure small masses.
FINDINGS: Spot tangential view of the region of recent clinical concern
upper-outer quadrant left breast is performed today. Heterogeneously
dense fibroglandular tissue is seen in this region, without any
suspicious findings. Whole breast views were not performed, given
the recent screening mammogram.

Mammographic images were processed with CAD.

Targeted ultrasound is performed, showing normal dense
fibroglandular tissue in the 2 o'clock axis of the left breast. No
solid or cystic mass or abnormal shadowing is identified.
IMPRESSION: No evidence of malignancy in the left breast.

RECOMMENDATION:
Screening mammogram in one year.(Code:LF-X-3WB)

I have discussed the findings and recommendations with the patient.
If applicable, a reminder letter will be sent to the patient
regarding the next appointment.

BI-RADS CATEGORY  1: Negative.

## 2022-09-18 ENCOUNTER — Telehealth: Payer: Self-pay | Admitting: Internal Medicine

## 2022-09-18 NOTE — Telephone Encounter (Signed)
Called patient to get her scheduled for a colonoscopy with Dr. Lavon Paganini per Dr. Rhea Belton. Left voicemail.

## 2022-12-21 ENCOUNTER — Ambulatory Visit (AMBULATORY_SURGERY_CENTER): Payer: Self-pay

## 2022-12-21 VITALS — Ht 65.0 in | Wt 140.0 lb

## 2022-12-21 DIAGNOSIS — Z1211 Encounter for screening for malignant neoplasm of colon: Secondary | ICD-10-CM

## 2022-12-21 MED ORDER — NA SULFATE-K SULFATE-MG SULF 17.5-3.13-1.6 GM/177ML PO SOLN
1.0000 | Freq: Once | ORAL | 0 refills | Status: AC
Start: 2022-12-21 — End: 2022-12-21

## 2022-12-21 NOTE — Progress Notes (Signed)
No egg or soy allergy known to patient   No issues known to pt with past sedation with any surgeries or procedures  Patient denies ever being told they had issues or difficulty with intubation   No FH of Malignant Hyperthermia  Pt is not on diet pills  Pt is not on  home 02   Pt is not on blood thinners   Pt denies issues with constipation   No A fib or A flutter  Have any cardiac testing pending--no  Pt instructed to use Singlecare.com or GoodRx for a price reduction on prep   Occasionally takes ozempic, instruct to not take for 7 days      Patient's chart reviewed by Cathlyn Parsons CNRA prior to previsit and patient appropriate for the [facility].  Previsit completed and red dot placed by patient's name on their procedure day (on provider's schedule).

## 2022-12-25 ENCOUNTER — Encounter: Payer: Self-pay | Admitting: Gastroenterology

## 2023-01-07 ENCOUNTER — Ambulatory Visit (AMBULATORY_SURGERY_CENTER): Payer: BC Managed Care – PPO | Admitting: Gastroenterology

## 2023-01-07 ENCOUNTER — Encounter: Payer: Self-pay | Admitting: Gastroenterology

## 2023-01-07 VITALS — BP 103/64 | HR 66 | Temp 97.3°F | Resp 18 | Ht 65.0 in | Wt 140.0 lb

## 2023-01-07 DIAGNOSIS — Z1211 Encounter for screening for malignant neoplasm of colon: Secondary | ICD-10-CM | POA: Diagnosis present

## 2023-01-07 DIAGNOSIS — K649 Unspecified hemorrhoids: Secondary | ICD-10-CM

## 2023-01-07 MED ORDER — SODIUM CHLORIDE 0.9 % IV SOLN: 500.0000 mL | Freq: Once | INTRAVENOUS | Status: DC

## 2023-01-07 MED ORDER — HYDROCORTISONE ACETATE 25 MG RE SUPP
25.0000 mg | Freq: Every day | RECTAL | 1 refills | Status: DC
Start: 2023-01-07 — End: 2024-04-03

## 2023-01-07 NOTE — Op Note (Signed)
[facility] Patient Name: Melinda Carrillo Procedure Date: 01/07/2023 9:13 AM MRN: [id number] Endoscopist: Napoleon Form , MD, [id number] Age: 47 Referring MD:  Date of Birth: 08-09-1975 Gender: Female Account #: [id number] Procedure:                Colonoscopy Indications:              Screening for colorectal malignant neoplasm Medicines:                Propofol per Anesthesia, Monitored Anesthesia Care Procedure:                After obtaining informed consent, the colonoscope                            was passed under direct vision. Throughout the                            procedure, the patient's blood pressure, pulse, and                            oxygen saturations were monitored continuously. The                            Olympus Scope SN: [id number] was introduced through                            the anus and advanced to the the cecum, identified                            by appendiceal orifice and ileocecal valve. The                            colonoscopy was performed without difficulty. The                            patient tolerated the procedure well. The quality                            of the bowel preparation was excellent. The                            terminal ileum, ileocecal valve, appendiceal                            orifice, and rectum were photographed. Scope In: 9:19:59 AM Scope Out: 9:35:29 AM Scope Withdrawal Time: 0 hours 10 minutes 12 seconds  Total Procedure Duration: 0 hours 15 minutes 30 seconds  Findings:                 The perianal and digital rectal examinations were                            normal.                           Non-bleeding external and internal hemorrhoids were  found during retroflexion. The hemorrhoids were                            medium-sized.                           Anal papilla(e) were hypertrophied.                           The exam was otherwise without  abnormality. Complications:            No immediate complications. Estimated Blood Loss:     Estimated blood loss was minimal. Impression:               - Non-bleeding external and internal hemorrhoids.                           - Anal papilla(e) were hypertrophied.                           - The examination was otherwise normal.                           - No specimens collected. Recommendation:           - Patient has a contact number available for                            emergencies. The signs and symptoms of potential                            delayed complications were discussed with the                            patient. Return to normal activities tomorrow.                            Written discharge instructions were provided to the                            patient.                           - Resume previous diet.                           - Continue present medications.                           - Repeat colonoscopy in 10 years for surveillance.                           - Use Benefiber Chewable one tablet PO BID.                           - Use hydrocortisone suppository 25 mg 1 per rectum  at bedtime for 1 week.                           - Return to GI office PRN for hemorrhoidal band                            ligation if develops symptoms. Napoleon Form, MD 01/07/2023 9:45:45 AM This report has been signed electronically.

## 2023-01-07 NOTE — Progress Notes (Signed)
Vss nad trans to pacu 

## 2023-01-07 NOTE — Patient Instructions (Signed)
-  Handout on hemorrhoid banding and hemorrhoid provided -repeat colonoscopy in 10years  for surveillance recommended.  -Continue present medications  prescription sent to [facility]. Use Benefiber Chewable one tablet by mouth twice a day.   YOU HAD AN ENDOSCOPIC PROCEDURE TODAY AT THE [facility]:   Refer to the procedure report that was given to you for any specific questions about what was found during the examination.  If the procedure report does not answer your questions, please call your gastroenterologist to clarify.  If you requested that your care partner not be given the details of your procedure findings, then the procedure report has been included in a sealed envelope for you to review at your convenience later.  YOU SHOULD EXPECT: Some feelings of bloating in the abdomen. Passage of more gas than usual.  Walking can help get rid of the air that was put into your GI tract during the procedure and reduce the bloating. If you had a lower endoscopy (such as a colonoscopy or flexible sigmoidoscopy) you may notice spotting of blood in your stool or on the toilet paper. If you underwent a bowel prep for your procedure, you may not have a normal bowel movement for a few days.  Please Note:  You might notice some irritation and congestion in your nose or some drainage.  This is from the oxygen used during your procedure.  There is no need for concern and it should clear up in a day or so.  SYMPTOMS TO REPORT IMMEDIATELY:  Following lower endoscopy (colonoscopy or flexible sigmoidoscopy):  Excessive amounts of blood in the stool  Significant tenderness or worsening of abdominal pains  Swelling of the abdomen that is new, acute  Fever of 100F or higher  For urgent or emergent issues, a gastroenterologist can be reached at any hour by calling [phone]. Do not use MyChart messaging for urgent concerns.    DIET:  We do recommend a small meal at first, but then you may proceed  to your regular diet.  Drink plenty of fluids but you should avoid alcoholic beverages for 24 hours.  ACTIVITY:  You should plan to take it easy for the rest of today and you should NOT DRIVE or use heavy machinery until tomorrow (because of the sedation medicines used during the test).    FOLLOW UP: Our staff will call the number listed on your records the next business day following your procedure.  We will call around 7:15- 8:00 am to check on you and address any questions or concerns that you may have regarding the information given to you following your procedure. If we do not reach you, we will leave a message.     If any biopsies were taken you will be contacted by phone or by letter within the next 1-3 weeks.  Please call us at [phone] if you have not heard about the biopsies in 3 weeks.    SIGNATURES/CONFIDENTIALITY: You and/or your care partner have signed paperwork which will be entered into your electronic medical record.  These signatures attest to the fact that that the information above on your After Visit Summary has been reviewed and is understood.  Full responsibility of the confidentiality of this discharge information lies with you and/or your care-partner.

## 2023-01-07 NOTE — Progress Notes (Signed)
Pt's states no medical or surgical changes since previsit or office visit. 

## 2023-01-07 NOTE — Progress Notes (Signed)
[facility] History and Physical   Primary Care Physician:  Eartha Inch, MD   Reason for Procedure:  Colorectal cancer screening  Plan:    Screening colonoscopy with possible interventions as needed     HPI: Melinda Carrillo is a very pleasant 47 y.o. female here for screening colonoscopy. Denies any nausea, vomiting, abdominal pain, melena or bright red blood per rectum  The risks and benefits as well as alternatives of endoscopic procedure(s) have been discussed and reviewed. All questions answered. The patient agrees to proceed.    Past Medical History:  Diagnosis Date   Allergic rhinitis    Allergy    seasonal   Apertognathia    Basal cell carcinoma 2011   forehead   Family history of colon cancer    maternal gf   Heart murmur    Mitral valve prolapse    PONV (postoperative nausea and vomiting)    Squamous cell skin cancer     Past Surgical History:  Procedure Laterality Date   CESAREAN SECTION     x 3   CESAREAN SECTION  05/06/2012   Procedure: CESAREAN SECTION;  Surgeon: Turner Daniels, MD;  Location: [facility];  Service: Obstetrics;  Laterality: N/A;  Repeat edc 05/10/12   SKIN CANCER EXCISION     multiple   TONSILLECTOMY      Prior to Admission medications   Medication Sig Start Date End Date Taking? Authorizing Provider  BioGaia Probiotic (BIOGAIA/GERBER SOOTHE) LIQD Take 5 drops by mouth daily at 8 pm. Gut and skin   Yes [provider]  COLLAGEN PO Take by mouth.   Yes [provider]  Cyanocobalamin 2500 MCG CHEW daily. 12/23/20  Yes [provider]  MAGNESIUM ASPARTATE PO Take by mouth. 144 each, takes 2 every night   Yes [provider]  OVER THE COUNTER MEDICATION Several supplement, 75 nutrients Gut health, stress and mood balance A G One   Yes [provider]  VITAMIN D PO Vitamin D   Yes [provider]  ALPRAZolam Prudy Feeler) 0.25 MG tablet  10/28/22   [provider]  Atovaquone-Proguanil HCl 62.5-25 MG tablet Take by mouth. Patient not taking: Reported on 01/07/2023 11/11/22   [provider]  Azelastine-Fluticasone 137-50 MCG/ACT SUSP Place 1 spray into the nose 2 (two) times daily. Patient not taking: Reported on 12/21/2022 07/06/16   Bobbitt, Heywood Iles, MD  Bepotastine Besilate (BEPREVE) 1.5 % SOLN Place 1 drop into both eyes daily as needed.     [provider]  budesonide (RHINOCORT ALLERGY) 32 MCG/ACT nasal spray Place 1 spray into both nostrils daily as needed.     [provider]  cetirizine (ZYRTEC) 10 MG tablet Take 10 mg by mouth daily as needed.     [provider]  Cholecalciferol (D 1000) 25 MCG (1000 UT) capsule Take by mouth. Patient not taking: Reported on 12/21/2022    [provider]  Clindamycin-Benzoyl Per, Refr, gel  11/02/22   [provider]  levocetirizine (XYZAL) 5 MG tablet Take 1 tablet (5 mg total) by mouth every evening. Patient not taking: Reported on 01/07/2023 07/06/16   Bobbitt, Heywood Iles, MD  levonorgestrel (MIRENA, 52 MG,) 20 MCG/DAY IUD Mirena Take No date recorded No form recorded No frequency recorded No route recorded No set duration recorded No set duration amount recorded active No dosage strength recorded No dosage strength units of measure recorded Patient not taking: Reported on 01/07/2023 05/09/18  [provider]  magnesium gluconate 54mg /74ml syringe Take by mouth. Patient not taking: Reported on 01/07/2023    [provider]  Olopatadine HCl (PAZEO) 0.7 % SOLN Place 1 drop into both eyes daily as needed. Patient not taking: Reported on 01/07/2023 07/06/16   Cristal Ford, MD  ondansetron (ZOFRAN) 8 MG tablet Take by mouth. Patient not taking: Reported on 01/07/2023 10/28/22   [provider]  Semaglutide,0.25 or 0.5MG /DOS, (OZEMPIC, 0.25 OR 0.5 MG/DOSE,) 2 MG/1.5ML SOPN Inject into the skin. 06/02/21   [provider]  Semaglutide,0.25 or 0.5MG /DOS, (OZEMPIC, 0.25 OR 0.5 MG/DOSE,) 2 MG/3ML SOPN Inject by subcutaneous route. Patient not taking: Reported on 01/07/2023    [provider]    Current Outpatient Medications  Medication Sig Dispense Refill   BioGaia Probiotic (BIOGAIA/GERBER SOOTHE) LIQD Take 5 drops by mouth daily at 8 pm. Gut and skin     COLLAGEN PO Take by mouth.     Cyanocobalamin 2500 MCG CHEW daily.     MAGNESIUM ASPARTATE PO Take by mouth. 144 each, takes 2 every night     OVER THE COUNTER MEDICATION Several supplement, 75 nutrients Gut health, stress and mood balance A G One     VITAMIN D PO Vitamin D     ALPRAZolam (XANAX) 0.25 MG tablet  (Patient not taking: Reported on 01/07/2023)     Atovaquone-Proguanil HCl 62.5-25 MG tablet Take by mouth. (Patient not taking: Reported on 01/07/2023)     Azelastine-Fluticasone 137-50 MCG/ACT SUSP Place 1 spray into the nose 2 (two) times daily. (Patient not taking: Reported on 12/21/2022) 23 g 5   Bepotastine Besilate (BEPREVE) 1.5 % SOLN Place 1 drop into both eyes daily as needed.      budesonide (RHINOCORT ALLERGY) 32 MCG/ACT nasal spray Place 1 spray into both nostrils daily as needed.      cetirizine (ZYRTEC) 10 MG tablet Take 10 mg by mouth daily as needed.      Cholecalciferol (D 1000) 25 MCG (1000 UT) capsule Take by mouth. (Patient not taking: Reported on 12/21/2022)     Clindamycin-Benzoyl Per, Refr, gel      levocetirizine (XYZAL) 5 MG tablet Take 1 tablet (5 mg total) by mouth every evening. (Patient not taking: Reported on 01/07/2023) 30 tablet 5   levonorgestrel (MIRENA, 52 MG,) 20 MCG/DAY IUD Mirena Take No date recorded No form recorded No frequency recorded No route recorded No set duration recorded No set duration amount recorded active No dosage strength recorded No dosage strength units of measure recorded (Patient not taking: Reported on 01/07/2023)     magnesium gluconate 54mg /41ml syringe Take by mouth.  (Patient not taking: Reported on 01/07/2023)     Olopatadine HCl (PAZEO) 0.7 % SOLN Place 1 drop into both eyes daily as needed. (Patient not taking: Reported on 01/07/2023) 1 Bottle 5   ondansetron (ZOFRAN) 8 MG tablet Take by mouth. (Patient not taking: Reported on 01/07/2023)     Semaglutide,0.25 or 0.5MG /DOS, (OZEMPIC, 0.25 OR 0.5 MG/DOSE,) 2 MG/1.5ML SOPN Inject into the skin.     Semaglutide,0.25 or 0.5MG /DOS, (OZEMPIC, 0.25 OR 0.5 MG/DOSE,) 2 MG/3ML SOPN Inject by subcutaneous route. (Patient not taking: Reported on 01/07/2023)     Current Facility-Administered Medications  Medication Dose Route Frequency Provider Last Rate Last Admin   0.9 %  sodium chloride infusion  500 mL Intravenous Once Napoleon Form, MD        Allergies as of 01/07/2023   (No Known Allergies)  Family History  Problem Relation Age of Onset   Colon polyps Mother    Irritable bowel syndrome Mother    Other Father 47       healthy   Other Brother 19       healthy   Colon cancer Maternal Grandfather 69   Colon polyps Maternal Grandfather    Allergic rhinitis Neg Hx    Angioedema Neg Hx    Asthma Neg Hx    Eczema Neg Hx    Immunodeficiency Neg Hx    Urticaria Neg Hx    Esophageal cancer Neg Hx    Rectal cancer Neg Hx    Stomach cancer Neg Hx     Social History   Socioeconomic History   Marital status: Married    Spouse name: Not on file   Number of children: 2   Years of education: Not on file   Highest education level: Not on file  Occupational History   Occupation: BANKING  Tobacco Use   Smoking status: Never   Smokeless tobacco: Never  Vaping Use   Vaping status: Never Used  Substance and Sexual Activity   Alcohol use: Yes    Comment: 7 drinks weekly   Drug use: Never   Sexual activity: Yes  Other Topics Concern   Not on file  Social History Narrative   Not on file   Social Determinants of Health   Financial Resource Strain: Low Risk  (10/25/2022)   Received from [facility], [facility]   Overall Financial Resource Strain (CARDIA)    Difficulty of Paying Living Expenses: Not hard at all  Food Insecurity: No Food Insecurity (10/25/2022)   Received from [facility], [facility]   Hunger Vital Sign    Worried About Running Out of Food in the Last Year: Never true    Ran Out of Food in the Last Year: Never true  Transportation Needs: No Transportation Needs (10/25/2022)   Received from [facility], [facility]   PRAPARE - Transportation    Lack of Transportation (Medical): No    Lack of Transportation (Non-Medical): No  Physical Activity: Sufficiently Active (10/25/2022)   Received from [facility], [facility]   Exercise Vital Sign    Days of Exercise per Week: 4 days    Minutes of Exercise per Session: 40 min  Stress: No Stress Concern Present (10/25/2022)   Received from [facility], [facility] - Occupational Stress Questionnaire    Feeling of Stress : Only a little  Social Connections: Socially Integrated (10/25/2022)   Received from [facility], [facility]   Social Network    How would you rate your social network (family, work, friends)?: Good participation with social networks  Intimate Partner Violence: Not At Risk (10/25/2022)   Received from [facility], [facility]   HITS    Over the last 12 months how often did your partner physically hurt you?: 1    Over the last 12 months how often did your partner insult you or talk down to you?: 1    Over the last 12 months how often did your partner threaten you with physical harm?: 1    Over the last 12 months how often did your partner scream or curse at you?: 1    Review of Systems:  All other review of systems negative except as mentioned in the HPI.  Physical Exam: Vital signs in last 24 hours: BP (!) 98/58  Pulse 63   Temp (!) 97.3 F (36.3 C)   Ht 5\' 5"  (1.651 m)   Wt 140 lb (63.5 kg)   LMP 12/15/2022 (Exact Date)  Comment: Patient has also signed pregnancy test waiver  SpO2 100%   BMI 23.30 kg/m  General:   Alert, NAD Lungs:  Clear .   Heart:  Regular rate and rhythm Abdomen:  Soft, nontender and nondistended. Neuro/Psych:  Alert and cooperative. Normal mood and affect. A and O x 3  Reviewed labs, radiology imaging, old records and pertinent past GI work up  Patient is appropriate for planned procedure(s) and anesthesia in an ambulatory setting   K. Scherry Ran , MD [phone]

## 2023-01-08 ENCOUNTER — Telehealth: Payer: Self-pay | Admitting: *Deleted

## 2023-01-08 NOTE — Telephone Encounter (Signed)
  Follow up Call-     01/07/2023    8:24 AM  Call back number  Post procedure Call Back phone  # [phone]  Permission to leave phone message Yes     Patient questions:  Do you have a fever, pain , or abdominal swelling? No. Pain Score  0 *  Have you tolerated food without any problems? Yes.    Have you been able to return to your normal activities? Yes.    Do you have any questions about your discharge instructions: Diet   No. Medications  No. Follow up visit  No.  Do you have questions or concerns about your Care? No.  Actions: * If pain score is 4 or above: No action needed, pain <4.

## 2024-04-03 ENCOUNTER — Encounter: Payer: Self-pay | Admitting: Cardiovascular Disease

## 2024-04-03 ENCOUNTER — Ambulatory Visit: Attending: Cardiovascular Disease | Admitting: Cardiovascular Disease

## 2024-04-03 VITALS — BP 100/64 | HR 70 | Ht 65.0 in | Wt 136.4 lb

## 2024-04-03 DIAGNOSIS — I351 Nonrheumatic aortic (valve) insufficiency: Secondary | ICD-10-CM | POA: Diagnosis not present

## 2024-04-03 DIAGNOSIS — I341 Nonrheumatic mitral (valve) prolapse: Secondary | ICD-10-CM | POA: Diagnosis not present

## 2024-04-03 NOTE — Patient Instructions (Signed)
 Medication Instructions:  No medication changes were made at this visit. Continue current regimen.   *If you need a refill on your cardiac medications before your next appointment, please call your pharmacy*  Lab Work: None ordered today. If you have labs (blood work) drawn today and your tests are completely normal, you will receive your results only by: MyChart Message (if you have MyChart) OR A paper copy in the mail If you have any lab test that is abnormal or we need to change your treatment, we will call you to review the results.  Testing/Procedures: Your physician has requested that you have an echocardiogram. Echocardiography is a painless test that uses sound waves to create images of your heart. It provides your doctor with information about the size and shape of your heart and how well your heart's chambers and valves are working. This procedure takes approximately one hour. There are no restrictions for this procedure. Please do NOT wear cologne, perfume, aftershave, or lotions (deodorant is allowed). Please arrive 15 minutes prior to your appointment time.  Please note: We ask at that you not bring children with you during ultrasound (echo/ vascular) testing. Due to room size and safety concerns, children are not allowed in the ultrasound rooms during exams. Our front office staff cannot provide observation of children in our lobby area while testing is being conducted. An adult accompanying a patient to their appointment will only be allowed in the ultrasound room at the discretion of the ultrasound technician under special circumstances. We apologize for any inconvenience.   Follow-Up: At [facility] you and your health needs are our priority.  As part of our continuing mission to provide you with exceptional heart care, our providers are all part of one team.  This team includes your primary Cardiologist (physician) and Advanced Practice Providers or APPs (Physician  Assistants and Nurse Practitioners) who all work together to provide you with the care you need, when you need it.  Your next appointment:   3 year(s)  Provider:   Ozell Fell, MD

## 2024-04-03 NOTE — Progress Notes (Signed)
 Cardiology Office Note:    Date:  04/03/2024   ID:  Melinda Carrillo, DOB 06-02-1975, MRN [id number]  PCP:  Sophronia Ozell BROCKS, MD   [facility] Providers Cardiologist:  Candyce Reek, MD     Referring MD: Sophronia Ozell BROCKS, MD   Chief Complaint  Patient presents with   Mitral Valve Prolapse    History of Present Illness:    Melinda Carrillo is a 48 y.o. female presenting for cardiac evaluation of heart murmur and mitral valve prolapse.  The patient was last seen in our practice in 2021 by Dr. Hobart.  She was felt to be doing very well with no cardiac symptoms.  A surveillance echocardiogram was performed and showed LVEF 60 to 65%, normal RV function, normal mitral valve with trivial MR, and mild aortic sclerosis with mild aortic regurgitation.  The patient is here alone today.  She is doing very well.  She exercises regularly with no exertional symptoms.  She denies chest pain, chest pressure, shortness of breath, lightheadedness, or heart palpitations.  Past Medical History:  Diagnosis Date   Allergic rhinitis    Allergy    seasonal   Apertognathia    Basal cell carcinoma 2011   forehead   Family history of colon cancer    maternal gf   Heart murmur    Mitral valve prolapse    PONV (postoperative nausea and vomiting)    Squamous cell skin cancer    Past Surgical History:  Procedure Laterality Date   CESAREAN SECTION     x 3   CESAREAN SECTION  05/06/2012   Procedure: CESAREAN SECTION;  Surgeon: Alm BROCKS Cook, MD;  Location: [facility];  Service: Obstetrics;  Laterality: N/A;  Repeat edc 05/10/12   SKIN CANCER EXCISION     multiple   TONSILLECTOMY      Current Medications: Current Meds  Medication Sig   ALPRAZolam (XANAX) 0.25 MG tablet  (Patient taking differently: Take 0.25 mg by mouth as needed. Per patient taking for long air flights.)   cetirizine (ZYRTEC) 10 MG tablet Take 10 mg by mouth daily as needed.     Cholecalciferol (D 1000) 25 MCG (1000 UT) capsule Take by mouth.   Clindamycin-Benzoyl Per, Refr, gel    COLLAGEN PO Take by mouth.   Cyanocobalamin 2500 MCG CHEW daily.   levocetirizine (XYZAL ) 5 MG tablet Take 1 tablet (5 mg total) by mouth every evening.   MAGNESIUM ASPARTATE PO Take by mouth. 144 each, takes 2 every night   OVER THE COUNTER MEDICATION Several supplement, 75 nutrients Gut health, stress and mood balance A G One   Probiotic Product (PROBIOTIC DAILY PO) Take by mouth daily.   tirzepatide (ZEPBOUND) 5 MG/0.5ML injection vial Inject 5 mg into the skin every 14 (fourteen) days.   VITAMIN D PO Vitamin D   WHEY PROTEIN PO Take by mouth daily.   [DISCONTINUED] Azelastine -Fluticasone  137-50 MCG/ACT SUSP Place 1 spray into the nose 2 (two) times daily.   [DISCONTINUED] Bepotastine Besilate (BEPREVE) 1.5 % SOLN Place 1 drop into both eyes daily as needed.    [DISCONTINUED] BioGaia Probiotic (BIOGAIA/GERBER SOOTHE) LIQD Take 5 drops by mouth daily at 8 pm. Gut and skin   [DISCONTINUED] Olopatadine  HCl (PAZEO) 0.7 % SOLN Place 1 drop into both eyes daily as needed.     Allergies:   Patient has no known allergies.   ROS:   Please see the history of present illness.    All other  systems reviewed and are negative.  EKGs/Labs/Other Studies Reviewed:    The following studies were reviewed today: Cardiac Studies & Procedures   ______________________________________________________________________________________________     ECHOCARDIOGRAM  ECHOCARDIOGRAM COMPLETE 02/22/2020  Narrative ECHOCARDIOGRAM REPORT    Patient Name:   SHEMEKIA PATANE [facility] Date of Exam: 02/22/2020 Medical Rec #:  [id number]                   Height:       64.0 in Accession #:    [id number]                  Weight:       132.6 lb Date of Birth:  [birth year]                    BSA:          1.643 m Patient Age:    44 years                    BP:           110/70 mmHg Patient Gender: F                            HR:           55 bpm. Exam Location:  [address]  Procedure: 2D Echo, 3D Echo, Cardiac Doppler and Color Doppler  Indications:    R01.1 Murmur  History:        Patient has prior history of Echocardiogram examinations, most recent 02/10/2011. Mitral Valve Prolapse; Signs/Symptoms:Murmur. Aortic Insufficiency, Mitral Regurgitation.  Sonographer:    Heather Hawks RDCS Referring Phys: [id number] HEATHER E [place]  IMPRESSIONS   1. Left ventricular ejection fraction, by estimation, is 60 to 65%. The left ventricle has normal function. The left ventricle has no regional wall motion abnormalities. Left ventricular diastolic parameters were normal. 2. Right ventricular systolic function is normal. The right ventricular size is normal. Tricuspid regurgitation signal is inadequate for assessing PA pressure. 3. Left atrial size was mildly dilated. 4. The mitral valve is normal in structure. Trivial mitral valve regurgitation. No evidence of mitral stenosis. 5. The aortic valve is tricuspid. Aortic valve regurgitation is mild. Mild aortic valve sclerosis is present, with no evidence of aortic valve stenosis. 6. The inferior vena cava is normal in size with greater than 50% respiratory variability, suggesting right atrial pressure of 3 mmHg.  FINDINGS Left Ventricle: Left ventricular ejection fraction, by estimation, is 60 to 65%. The left ventricle has normal function. The left ventricle has no regional wall motion abnormalities. The left ventricular internal cavity size was normal in size. There is no left ventricular hypertrophy. Left ventricular diastolic parameters were normal.  Right Ventricle: The right ventricular size is normal.Right ventricular systolic function is normal. Tricuspid regurgitation signal is inadequate for assessing PA pressure. The tricuspid regurgitant velocity is 1.68 m/s, and with an assumed right atrial pressure of 3 mmHg, the estimated right  ventricular systolic pressure is 14.3 mmHg.  Left Atrium: Left atrial size was mildly dilated.  Right Atrium: Right atrial size was normal in size.  Pericardium: There is no evidence of pericardial effusion.  Mitral Valve: The mitral valve is normal in structure. Trivial mitral valve regurgitation. No evidence of mitral valve stenosis.  Tricuspid Valve: The tricuspid valve is normal in structure. Tricuspid valve regurgitation is trivial. No evidence of tricuspid stenosis.  Aortic Valve: The aortic  valve is tricuspid. Aortic valve regurgitation is mild. Aortic regurgitation PHT measures 462 msec. Mild aortic valve sclerosis is present, with no evidence of aortic valve stenosis.  Pulmonic Valve: The pulmonic valve was normal in structure. Pulmonic valve regurgitation is trivial. No evidence of pulmonic stenosis.  Aorta: The aortic root is normal in size and structure.  Venous: The inferior vena cava is normal in size with greater than 50% respiratory variability, suggesting right atrial pressure of 3 mmHg.  IAS/Shunts: No atrial level shunt detected by color flow Doppler.   LEFT VENTRICLE PLAX 2D LVIDd:         4.70 cm  Diastology LVIDs:         3.05 cm  LV e' medial:    13.80 cm/s LV PW:         0.70 cm  LV E/e' medial:  6.0 LV IVS:        0.70 cm  LV e' lateral:   19.90 cm/s LVOT diam:     2.30 cm  LV E/e' lateral: 4.1 LV SV:         118 LV SV Index:   72 LVOT Area:     4.15 cm  3D Volume EF: 3D EF:        65 % LV EDV:       125 ml LV ESV:       44 ml LV SV:        80 ml  RIGHT VENTRICLE RV S prime:     16.80 cm/s TAPSE (M-mode): 2.4 cm  LEFT ATRIUM             Index       RIGHT ATRIUM           Index LA diam:        3.50 cm 2.13 cm/m  RA Area:     17.50 cm LA Vol (A2C):   84.1 ml 51.19 ml/m RA Volume:   48.60 ml  29.58 ml/m LA Vol (A4C):   62.4 ml 37.98 ml/m LA Biplane Vol: 74.6 ml 45.41 ml/m AORTIC VALVE LVOT Vmax:   106.00 cm/s LVOT Vmean:  74.000  cm/s LVOT VTI:    0.283 m AI PHT:      462 msec  AORTA Ao Root diam: 3.10 cm Ao Asc diam:  2.90 cm  MITRAL VALVE               TRICUSPID VALVE MV Area (PHT): cm         TR Peak grad:   11.3 mmHg MV Decel Time: 298 msec    TR Vmax:        168.00 cm/s MV E velocity: 82.30 cm/s MV A velocity: 51.40 cm/s  SHUNTS MV E/A ratio:  1.60        Systemic VTI:  0.28 m Systemic Diam: 2.30 cm  Redell Shallow MD Electronically signed by Redell Shallow MD Signature Date/Time: 02/22/2020/12:42:44 PM    Final          ______________________________________________________________________________________________      EKG:   EKG Interpretation Date/Time:  Monday April 03 2024 14:10:42 EST Ventricular Rate:  70 PR Interval:  198 QRS Duration:  82 QT Interval:  380 QTC Calculation: 410 R Axis:   94  Text Interpretation: Normal sinus rhythm Right atrial enlargement Rightward axis No significant change from prior tracing Confirmed by Wonda Sharper [phone]) on 04/03/2024 2:35:21 PM    Recent Labs: No results found for requested labs within  last 365 days.  Recent Lipid Panel No results found for: CHOL, TRIG, HDL, CHOLHDL, VLDL, LDLCALC, LDLDIRECT   Risk Assessment/Calculations:                Physical Exam:    VS:  BP 100/64 (BP Location: Left Arm, Patient Position: Sitting, Cuff Size: Normal)   Pulse 70   Ht 5' 5 (1.651 m)   Wt 136 lb 6.4 oz (61.9 kg)   SpO2 98%   BMI 22.70 kg/m     Wt Readings from Last 3 Encounters:  04/03/24 136 lb 6.4 oz (61.9 kg)  01/07/23 140 lb (63.5 kg)  12/21/22 140 lb (63.5 kg)     GEN:  Well nourished, well developed in no acute distress HEENT: Normal NECK: No JVD; No carotid bruits LYMPHATICS: No lymphadenopathy CARDIAC: RRR, no murmurs, rubs, gallops RESPIRATORY:  Clear to auscultation without rales, wheezing or rhonchi  ABDOMEN: Soft, non-tender, non-distended MUSCULOSKELETAL:  No edema; No deformity  SKIN:  Warm and dry NEUROLOGIC:  Alert and oriented x 3 PSYCHIATRIC:  Normal affect   Assessment & Plan Nonrheumatic aortic valve insufficiency Last echo reviewed from 4 years ago.  Suspect this was just an incidental finding.  Will repeat for surveillance.  I cannot appreciate an aortic insufficiency murmur on her exam today. Mitral valve prolapse Not appreciated on last study from 2021.  Will update an echocardiogram to reassess.  I do not hear physical exam findings suggestive of mitral valve prolapse or regurgitation today.  The patient appears to be doing very well from a cardiovascular perspective with no symptoms at this time.  Will order an echocardiogram to follow-up her mild valvular heart disease.  I will plan to see her back in 3 years for follow-up evaluation.            Medication Adjustments/Labs and Tests Ordered: Current medicines are reviewed at length with the patient today.  Concerns regarding medicines are outlined above.  Orders Placed This Encounter  Procedures   EKG 12-Lead   ECHOCARDIOGRAM COMPLETE   No orders of the defined types were placed in this encounter.   Patient Instructions  Medication Instructions:  No medication changes were made at this visit. Continue current regimen.   *If you need a refill on your cardiac medications before your next appointment, please call your pharmacy*  Lab Work: None ordered today. If you have labs (blood work) drawn today and your tests are completely normal, you will receive your results only by: MyChart Message (if you have MyChart) OR A paper copy in the mail If you have any lab test that is abnormal or we need to change your treatment, we will call you to review the results.  Testing/Procedures: Your physician has requested that you have an echocardiogram. Echocardiography is a painless test that uses sound waves to create images of your heart. It provides your doctor with information about the size and shape of  your heart and how well your heart's chambers and valves are working. This procedure takes approximately one hour. There are no restrictions for this procedure. Please do NOT wear cologne, perfume, aftershave, or lotions (deodorant is allowed). Please arrive 15 minutes prior to your appointment time.  Please note: We ask at that you not bring children with you during ultrasound (echo/ vascular) testing. Due to room size and safety concerns, children are not allowed in the ultrasound rooms during exams. Our front office staff cannot provide observation of children in our lobby area while  testing is being conducted. An adult accompanying a patient to their appointment will only be allowed in the ultrasound room at the discretion of the ultrasound technician under special circumstances. We apologize for any inconvenience.   Follow-Up: At [facility], you and your health needs are our priority.  As part of our continuing mission to provide you with exceptional heart care, our providers are all part of one team.  This team includes your primary Cardiologist (physician) and Advanced Practice Providers or APPs (Physician Assistants and Nurse Practitioners) who all work together to provide you with the care you need, when you need it.  Your next appointment:   3 year(s)  Provider:   Ozell Fell, MD     Signed, Ozell Fell, MD  04/03/2024 4:39 PM    [facility]

## 2024-04-13 ENCOUNTER — Other Ambulatory Visit: Payer: Self-pay | Admitting: Obstetrics and Gynecology

## 2024-04-13 DIAGNOSIS — R9230 Dense breasts, unspecified: Secondary | ICD-10-CM

## 2024-04-26 ENCOUNTER — Inpatient Hospital Stay
Admission: RE | Admit: 2024-04-26 | Discharge: 2024-04-26 | Attending: Obstetrics and Gynecology | Admitting: Obstetrics and Gynecology

## 2024-04-26 DIAGNOSIS — R9230 Dense breasts, unspecified: Secondary | ICD-10-CM

## 2024-04-26 MED ORDER — GADOPICLENOL 0.5 MMOL/ML IV SOLN
6.0000 mL | Freq: Once | INTRAVENOUS | Status: AC | PRN
  Administered 2024-04-26: 10:00:00 6 mL via INTRAVENOUS

## 2024-04-27 ENCOUNTER — Other Ambulatory Visit: Payer: Self-pay | Admitting: Obstetrics and Gynecology

## 2024-04-27 DIAGNOSIS — R928 Other abnormal and inconclusive findings on diagnostic imaging of breast: Secondary | ICD-10-CM

## 2024-05-18 ENCOUNTER — Ambulatory Visit
Admission: RE | Admit: 2024-05-18 | Discharge: 2024-05-18 | Disposition: A | Discharge status: Home / Self Care | Source: Ambulatory Visit | Attending: Obstetrics and Gynecology | Admitting: Obstetrics and Gynecology

## 2024-05-18 DIAGNOSIS — R928 Other abnormal and inconclusive findings on diagnostic imaging of breast: Secondary | ICD-10-CM

## 2024-05-18 MED ORDER — GADOPICLENOL 0.5 MMOL/ML IV SOLN
6.0000 mL | Freq: Once | INTRAVENOUS | Status: AC | PRN
  Administered 2024-05-18: 6 mL via INTRAVENOUS

## 2024-05-19 LAB — SURGICAL PATHOLOGY

## 2024-05-23 ENCOUNTER — Ambulatory Visit (HOSPITAL_COMMUNITY)
Admission: RE | Admit: 2024-05-23 | Discharge: 2024-05-23 | Disposition: A | Discharge status: Home / Self Care | Source: Ambulatory Visit | Attending: Cardiology | Admitting: Cardiology

## 2024-05-23 DIAGNOSIS — I351 Nonrheumatic aortic (valve) insufficiency: Secondary | ICD-10-CM | POA: Diagnosis present

## 2024-05-23 LAB — ECHOCARDIOGRAM COMPLETE
Area-P 1/2: 3.65 cm2
P 1/2 time: 560 ms
S' Lateral: 2.9 cm

## 2024-05-30 ENCOUNTER — Ambulatory Visit: Payer: Self-pay | Admitting: Cardiovascular Disease

## 2024-05-30 DIAGNOSIS — I351 Nonrheumatic aortic (valve) insufficiency: Secondary | ICD-10-CM

## 2024-06-15 ENCOUNTER — Encounter: Admitting: Obstetrics and Gynecology

## 2024-06-15 ENCOUNTER — Ambulatory Visit: Payer: Self-pay | Admitting: Radiology

## 2024-06-15 ENCOUNTER — Encounter: Payer: Self-pay | Admitting: Radiology

## 2024-06-15 VITALS — BP 112/76 | HR 78 | Ht 64.75 in | Wt 131.0 lb

## 2024-06-15 DIAGNOSIS — N951 Menopausal and female climacteric states: Secondary | ICD-10-CM

## 2024-06-15 MED ORDER — ESTRADIOL 0.025 MG/24HR TD PTTW: 1.0000 | MEDICATED_PATCH | TRANSDERMAL | 1 refills | Status: AC

## 2024-06-15 MED ORDER — PROGESTERONE MICRONIZED 100 MG PO CAPS: 100.0000 mg | ORAL_CAPSULE | Freq: Every day | ORAL | 1 refills | Status: AC

## 2024-06-15 NOTE — Progress Notes (Signed)
" ° °  Melinda Carrillo [facility] 1975-09-13 [id number]   History:  49 y.o. G3P3 presents as a new patient to discuss HRT. Previously saw me at [facility]. C/o fatigue, lack of energy, mood changes. No vasomotor symptoms. Interested in HRT for protective benefits. Hx of murmur and mitral valve disorder, followed by cardiology, recent echo reassuring.   Gynecologic History Patient's last menstrual period was 06/04/2024 (exact date). Period Cycle (Days):  (25-28) Period Duration (Days): 4-5 Period Pattern: Regular Menstrual Flow: Heavy, Light Menstrual Control: Tampon Dysmenorrhea: None Contraception/Family planning: vasectomy Sexually active: yes Last Pap: 2024. Results were: normal Last mammogram: 2025, abnormal, breast MRI with negative biopsy last month  Obstetric History OB History  Gravida Para Term Preterm AB Living  3 3 3   3   SAB IAB Ectopic Multiple Live Births      3    # Outcome Date GA Lbr Len/2nd Weight Sex Type Anes PTL Lv  3 Term 05/06/12 [redacted]w[redacted]d  7 lb 12.3 oz (3.525 kg) M CS-LTranv     2 Term 10/2009 [redacted]w[redacted]d  7 lb 11 oz (3.487 kg) M CS-LTranv   LIV  1 Term 02/2008 [redacted]w[redacted]d  7 lb (3.175 kg) F CS-LTranv   LIV        No data to display           The following portions of the patient's history were reviewed and updated as appropriate: allergies, current medications, past family history, past medical history, past social history, past surgical history, and problem list.  ROS  Past medical history, past surgical history, family history and social history were all reviewed and documented in the EPIC chart.  Exam:  Vitals:   06/15/24 0944  BP: 112/76  Pulse: 78  SpO2: 97%  Weight: 131 lb (59.4 kg)  Height: 5' 4.75 (1.645 m)   Body mass index is 21.97 kg/m.  Physical Exam Constitutional:      Appearance: Normal appearance. She is normal weight.  Pulmonary:     Effort: Pulmonary effort is normal.  Neurological:     Mental Status: She is alert.  Psychiatric:         Mood and Affect: Mood normal.        Thought Content: Thought content normal.        Judgment: Judgment normal.      Darice Hoit, CMA present for exam  Assessment/Plan:   1. Perimenopausal symptoms (Primary) Risks and benefits of HRT reviewed. Will follow up in 3 months Due for AEX 9/26 - estradiol  (DOTTI ) 0.025 MG/24HR; Place 1 patch onto the skin 2 (two) times a week.  Dispense: 24 patch; Refill: 1 - progesterone  (PROMETRIUM ) 100 MG capsule; Take 1 capsule (100 mg total) by mouth daily.  Dispense: 90 capsule; Refill: 1     Return in about 12 weeks (around 09/07/2024) for Med Follow-up.  Parrish Daddario B WHNP-BC 10:12 AM 06/15/2024 "

## 2024-09-12 ENCOUNTER — Ambulatory Visit: Admitting: Radiology
